# Patient Record
Sex: Male | Born: 1955
Health system: Southern US, Community
[De-identification: ages and names within clinical notes are randomized; demographics above are authoritative.]

## PROBLEM LIST (undated history)

## (undated) DIAGNOSIS — G709 Myoneural disorder, unspecified: Secondary | ICD-10-CM

## (undated) DIAGNOSIS — G20A1 Parkinson's disease without dyskinesia, without mention of fluctuations: Secondary | ICD-10-CM

## (undated) DIAGNOSIS — K4090 Unilateral inguinal hernia, without obstruction or gangrene, not specified as recurrent: Secondary | ICD-10-CM

## (undated) DIAGNOSIS — J61 Pneumoconiosis due to asbestos and other mineral fibers: Secondary | ICD-10-CM

## (undated) DIAGNOSIS — G20 Parkinson's disease: Secondary | ICD-10-CM

## (undated) HISTORY — DX: Pneumoconiosis due to asbestos and other mineral fibers: J61

## (undated) HISTORY — PX: COLONOSCOPY: SHX5424

## (undated) HISTORY — DX: Parkinson's disease without dyskinesia, without mention of fluctuations: G20.A1

## (undated) HISTORY — PX: ELBOW SURGERY: SHX618

## (undated) HISTORY — DX: Parkinson's disease: G20

## (undated) HISTORY — DX: Myoneural disorder, unspecified: G70.9

---

## 1999-01-11 ENCOUNTER — Ambulatory Visit (HOSPITAL_COMMUNITY): Admission: RE | Admit: 1999-01-11 | Discharge: 1999-01-11 | Payer: Self-pay

## 2005-02-26 ENCOUNTER — Encounter: Admission: RE | Admit: 2005-02-26 | Discharge: 2005-02-26 | Payer: Self-pay | Admitting: Family Medicine

## 2005-08-29 ENCOUNTER — Encounter: Admission: RE | Admit: 2005-08-29 | Discharge: 2005-08-29 | Payer: Self-pay | Admitting: Family Medicine

## 2006-09-24 ENCOUNTER — Encounter: Admission: RE | Admit: 2006-09-24 | Discharge: 2006-09-24 | Payer: Self-pay | Admitting: Family Medicine

## 2014-08-19 ENCOUNTER — Telehealth: Payer: Self-pay | Admitting: Family Medicine

## 2014-08-19 NOTE — Telephone Encounter (Signed)
Pt came by and appt was made

## 2014-08-19 NOTE — Telephone Encounter (Signed)
Pt states he is your neighbor and you know him by Aaron Robertson, he needs to be seen as a new pt to re-establish care since it has been greater than 3 years since we have seen him. He is concerned about Parkinsons Disease and thinks he is showing symptoms. I explained we don't have new pt appts at this time but he insists he is your neighbor and you would make an exception. Please advise

## 2014-08-22 ENCOUNTER — Ambulatory Visit (INDEPENDENT_AMBULATORY_CARE_PROVIDER_SITE_OTHER): Payer: 59 | Admitting: Family Medicine

## 2014-08-22 ENCOUNTER — Encounter: Payer: Self-pay | Admitting: Family Medicine

## 2014-08-22 ENCOUNTER — Encounter (INDEPENDENT_AMBULATORY_CARE_PROVIDER_SITE_OTHER): Payer: Self-pay

## 2014-08-22 ENCOUNTER — Ambulatory Visit (INDEPENDENT_AMBULATORY_CARE_PROVIDER_SITE_OTHER): Payer: 59

## 2014-08-22 VITALS — BP 122/74 | HR 65 | Temp 96.8°F | Ht 68.0 in | Wt 159.0 lb

## 2014-08-22 DIAGNOSIS — R5383 Other fatigue: Secondary | ICD-10-CM

## 2014-08-22 DIAGNOSIS — N400 Enlarged prostate without lower urinary tract symptoms: Secondary | ICD-10-CM | POA: Insufficient documentation

## 2014-08-22 DIAGNOSIS — R251 Tremor, unspecified: Secondary | ICD-10-CM

## 2014-08-22 DIAGNOSIS — G259 Extrapyramidal and movement disorder, unspecified: Secondary | ICD-10-CM

## 2014-08-22 DIAGNOSIS — Z0000 Encounter for general adult medical examination without abnormal findings: Secondary | ICD-10-CM

## 2014-08-22 DIAGNOSIS — R269 Unspecified abnormalities of gait and mobility: Secondary | ICD-10-CM | POA: Insufficient documentation

## 2014-08-22 LAB — POCT UA - MICROSCOPIC ONLY
Bacteria, U Microscopic: NEGATIVE
CASTS, UR, LPF, POC: NEGATIVE
CRYSTALS, UR, HPF, POC: NEGATIVE
MUCUS UA: NEGATIVE
WBC, Ur, HPF, POC: NEGATIVE
YEAST UA: NEGATIVE

## 2014-08-22 LAB — POCT URINALYSIS DIPSTICK
Bilirubin, UA: NEGATIVE
Glucose, UA: NEGATIVE
Ketones, UA: NEGATIVE
Leukocytes, UA: NEGATIVE
Nitrite, UA: NEGATIVE
Protein, UA: NEGATIVE
Spec Grav, UA: 1.015
Urobilinogen, UA: NEGATIVE
pH, UA: 6.5

## 2014-08-22 NOTE — Addendum Note (Signed)
Addended by: Selmer Dominion on: 08/22/2014 04:03 PM   Modules accepted: Orders

## 2014-08-22 NOTE — Progress Notes (Signed)
Subjective:    Patient ID: Aaron Robertson, male    DOB: 05-Dec-1955, 58 y.o.   MRN: [id number]  HPI Pt here for complete physical exam.the patient does have concerns about gait issues and dragging his feet and a weaker voice.he has also had some bilateral hand tremors.the patient denies chest pain and shortness of breath and trouble passing his water. He describes the tremors that he has worse on the right that had been going on for almost a year. The issues with talking and reduce strength seemed to be more recent as in the past 3 or 4 months. He also noticed that the tremor is worse in the right hand. He has had weight loss that was not from diet. It is significant that his father had Alzheimer's disease and his paternal grandmother had ALS. He has 2 younger siblings that are both male and they are in good health. He has 2 children and they are alive and well.      Review of Systems  Constitutional: Negative.   HENT: Negative.   Eyes: Negative.   Respiratory: Negative.   Cardiovascular: Negative.   Gastrointestinal: Negative.   Endocrine: Negative.   Genitourinary: Negative.   Musculoskeletal: Positive for gait problem.       Toes on the right foot had some burning when walking and now he notices he starts dragging his feet.  Skin: Negative.   Allergic/Immunologic: Negative.   Neurological: Positive for tremors.       Bilateral hand tremors, more in the right hand.  Has noticed that recently "his voice doesn't carry".  Hematological: Negative.   Psychiatric/Behavioral: Negative.        Objective:   Physical Exam  Constitutional: He is oriented to person, place, and time. He appears well-developed and well-nourished. No distress.  HENT:  Head: Normocephalic and atraumatic.  Nose: Nose normal.  Mouth/Throat: Oropharynx is clear and moist. No oropharyngeal exudate.  Bilateral ears cerumen  Eyes: Conjunctivae and EOM are normal. Pupils are equal, round, and reactive to  light. Right eye exhibits no discharge. Left eye exhibits no discharge. No scleral icterus.  Neck: Normal range of motion. Neck supple. No thyromegaly present.  No carotid bruits or anterior cervical adenopathy  Cardiovascular: Normal rate, regular rhythm, normal heart sounds and intact distal pulses.  Exam reveals no gallop and no friction rub.   No murmur heard. At 72/m  Pulmonary/Chest: Effort normal and breath sounds normal. No respiratory distress. He has no wheezes. He has no rales. He exhibits no tenderness.  No axillary adenopathy  Abdominal: Soft. Bowel sounds are normal. He exhibits no mass. There is no tenderness. There is no rebound and no guarding.  Genitourinary: Rectum normal and penis normal.  The prostate was enlarged but smooth. There were no rectal masses. There was no nodularity in the prostate. There were no inguinal nodes palpable. The external genitalia were normal and there were no hernias palpated.  Musculoskeletal: Normal range of motion. He exhibits no edema or tenderness.  Lymphadenopathy:    He has no cervical adenopathy.  Neurological: He is alert and oriented to person, place, and time. He has normal reflexes. No cranial nerve deficit.  Skin: Skin is warm and dry. No rash noted. No erythema. No pallor.  Psychiatric: He has a normal mood and affect. His behavior is normal. Judgment and thought content normal.  Nursing note and vitals reviewed.   BP 122/74 mmHg  Pulse 65  Temp(Src) 96.8 F (36 C) (Oral)  Ht 5' 8"  (1.727 m)  Wt 159 lb (72.122 kg)  BMI 24.18 kg/m2  JOI:NOMVEH sinus rhythm, normal EKG  [facility] reading (PRIMARY) by  Dr. Brunilda Payor x-ray--no active disease   MMSE 29 out of 30                                       Assessment & Plan:  1. Annual physical exam - POCT CBC; Future - BMP8+EGFR; Future - Hepatic function panel; Future - POCT urinalysis dipstick - POCT UA - Microscopic Only - Urine culture - Thyroid Panel With TSH;  Future - Vit D  25 hydroxy (rtn osteoporosis monitoring); Future - NMR, lipoprofile; Future - PSA, total and free; Future - EKG 12-Lead - DG Chest 2 View; Future  2. Other fatigue - POCT CBC; Future - BMP8+EGFR; Future - Hepatic function panel; Future - POCT urinalysis dipstick - POCT UA - Microscopic Only - Urine culture - Thyroid Panel With TSH; Future - Vit D  25 hydroxy (rtn osteoporosis monitoring); Future - NMR, lipoprofile; Future - PSA, total and free; Future - Ambulatory referral to Neurology; Future - EKG 12-Lead - DG Chest 2 View; Future  3. Tremors of nervous system - POCT CBC; Future - BMP8+EGFR; Future - Hepatic function panel; Future - Thyroid Panel With TSH; Future - Ambulatory referral to Neurology; Future  4. Abnormality of gait - POCT CBC; Future - Ambulatory referral to Neurology; Future  5. BPH (benign prostatic hyperplasia)  6. Movement disorder -referral to neurologist  Patient Instructions  Please return and get lab work as directed Return the FOBT We will call you with the results of the chest x-ray once those results are available Try to exercise frequently with rest periods during the day Eat as healthy as possible drink plenty of fluids We will arrange a visit with the neurologist hopefully in a month's time. Continue to be careful and do not put herself at risk for falling i.e. Avoid climbing as much as possible Check with your insurance regarding the Prevnar vaccine   Arrie Senate MD

## 2014-08-22 NOTE — Patient Instructions (Signed)
Please return and get lab work as directed Return the FOBT We will call you with the results of the chest x-ray once those results are available Try to exercise frequently with rest periods during the day Eat as healthy as possible drink plenty of fluids We will arrange a visit with the neurologist hopefully in a month's time. Continue to be careful and do not put herself at risk for falling i.e. Avoid climbing as much as possible Check with your insurance regarding the Prevnar vaccine

## 2014-08-23 LAB — URINE CULTURE: Organism ID, Bacteria: NO GROWTH

## 2014-08-24 ENCOUNTER — Other Ambulatory Visit (INDEPENDENT_AMBULATORY_CARE_PROVIDER_SITE_OTHER): Payer: 59

## 2014-08-24 DIAGNOSIS — G252 Other specified forms of tremor: Secondary | ICD-10-CM

## 2014-08-24 DIAGNOSIS — R5383 Other fatigue: Secondary | ICD-10-CM

## 2014-08-24 DIAGNOSIS — Z0000 Encounter for general adult medical examination without abnormal findings: Secondary | ICD-10-CM

## 2014-08-24 DIAGNOSIS — G250 Essential tremor: Secondary | ICD-10-CM

## 2014-08-24 DIAGNOSIS — R251 Tremor, unspecified: Secondary | ICD-10-CM

## 2014-08-24 LAB — POCT CBC
GRANULOCYTE PERCENT: 53.1 % (ref 37–80)
HEMATOCRIT: 45.7 % (ref 43.5–53.7)
Hemoglobin: 14.9 g/dL (ref 14.1–18.1)
Lymph, poc: 1.7 (ref 0.6–3.4)
MCH, POC: 29.7 pg (ref 27–31.2)
MCHC: 32.6 g/dL (ref 31.8–35.4)
MCV: 91.3 fL (ref 80–97)
MPV: 8.4 fL (ref 0–99.8)
POC Granulocyte: 2.2 (ref 2–6.9)
POC LYMPH %: 41.6 % (ref 10–50)
Platelet Count, POC: 163 10*3/uL (ref 142–424)
RBC: 5 M/uL (ref 4.69–6.13)
RDW, POC: 13.3 %
WBC: 4.2 10*3/uL — AB (ref 4.6–10.2)

## 2014-08-24 NOTE — Addendum Note (Signed)
Addended by: Wyline Mood on: 08/24/2014 08:13 AM   Modules accepted: Orders

## 2014-08-25 ENCOUNTER — Telehealth: Payer: Self-pay | Admitting: *Deleted

## 2014-08-25 LAB — BMP8+EGFR
BUN/Creatinine Ratio: 18 (ref 9–20)
BUN: 17 mg/dL (ref 6–24)
CALCIUM: 9.1 mg/dL (ref 8.7–10.2)
CO2: 27 mmol/L (ref 18–29)
CREATININE: 0.97 mg/dL (ref 0.76–1.27)
Chloride: 98 mmol/L (ref 97–108)
GFR calc Af Amer: 98 mL/min/{1.73_m2} (ref >59)
GFR, EST NON AFRICAN AMERICAN: 85 mL/min/{1.73_m2} (ref >59)
Glucose: 92 mg/dL (ref 65–99)
Potassium: 4.4 mmol/L (ref 3.5–5.2)
SODIUM: 140 mmol/L (ref 134–144)

## 2014-08-25 LAB — THYROID PANEL WITH TSH
FREE THYROXINE INDEX: 2 (ref 1.2–4.9)
T3 Uptake Ratio: 30 % (ref 24–39)
T4 TOTAL: 6.8 ug/dL (ref 4.5–12.0)
TSH: 2.63 u[IU]/mL (ref 0.450–4.500)

## 2014-08-25 LAB — PSA, TOTAL AND FREE
PSA FREE PCT: 62.5 %
PSA, Free: 0.25 ng/mL
PSA: 0.4 ng/mL (ref 0.0–4.0)

## 2014-08-25 LAB — HEPATIC FUNCTION PANEL
ALBUMIN: 4.1 g/dL (ref 3.5–5.5)
ALT: 12 IU/L (ref 0–44)
AST: 22 IU/L (ref 0–40)
Alkaline Phosphatase: 72 IU/L (ref 39–117)
BILIRUBIN DIRECT: 0.17 mg/dL (ref 0.00–0.40)
TOTAL PROTEIN: 6.6 g/dL (ref 6.0–8.5)
Total Bilirubin: 0.8 mg/dL (ref 0.0–1.2)

## 2014-08-25 LAB — NMR, LIPOPROFILE
Cholesterol: 210 mg/dL — ABNORMAL HIGH (ref 100–199)
HDL Cholesterol by NMR: 66 mg/dL (ref >39)
HDL Particle Number: 36.1 umol/L (ref >=30.5)
LDL PARTICLE NUMBER: 1610 nmol/L — AB (ref <1000)
LDL SIZE: 21 nm (ref >20.5)
LDL-C: 130 mg/dL — ABNORMAL HIGH (ref 0–99)
LP-IR Score: <25 (ref <=45)
Small LDL Particle Number: 704 nmol/L — ABNORMAL HIGH (ref <=527)
TRIGLYCERIDES BY NMR: 71 mg/dL (ref 0–149)

## 2014-08-25 LAB — VITAMIN D 25 HYDROXY (VIT D DEFICIENCY, FRACTURES): VIT D 25 HYDROXY: 32.7 ng/mL (ref 30.0–100.0)

## 2014-08-25 NOTE — Telephone Encounter (Signed)
Aware of results and was scheduled with clinical pharmacist.

## 2014-08-25 NOTE — Telephone Encounter (Signed)
-----   Message from Chipper Herb, MD sent at 08/25/2014  7:28 AM EST ----- All thyroid function tests are within normal limits The vitamin D level is at the low end of the normal range, please have the patient start vitamin D3 1000, one daily. The Now Brand is preferred---this is at [facility] or the drugstore in [place] Cholesterol numbers with advanced lipid testing have an LDL particle number that is elevated at 1610. The LDL C is elevated at 130.  The triglycerides are good at 71. The good cholesterol the HDL particle number is good.----Please schedule this patient for an appointment with the clinical pharmacist to further discuss cholesterol management and treatment+++++++ The PSA remains low and within normal limits The blood sugar is good at 92. The creatinine, the most important kidney function test is within normal limits. The electrolytes including potassium are within normal limits All liver function tests are within normal limits

## 2014-09-27 ENCOUNTER — Ambulatory Visit (INDEPENDENT_AMBULATORY_CARE_PROVIDER_SITE_OTHER): Payer: 59 | Admitting: Neurology

## 2014-09-27 ENCOUNTER — Encounter: Payer: Self-pay | Admitting: Neurology

## 2014-09-27 VITALS — BP 128/72 | HR 80 | Ht 68.0 in | Wt 159.0 lb

## 2014-09-27 DIAGNOSIS — G20A1 Parkinson's disease without dyskinesia, without mention of fluctuations: Secondary | ICD-10-CM | POA: Insufficient documentation

## 2014-09-27 DIAGNOSIS — G20 Parkinson's disease: Secondary | ICD-10-CM | POA: Insufficient documentation

## 2014-09-27 NOTE — Patient Instructions (Signed)
1.  I will see you back in 6 months.  Call me if you need me before tht time 2.  You can look up on the [place] website our event on 12/23/14 and will be able to register there soon.  The event will be at the [facility].

## 2014-09-27 NOTE — Progress Notes (Signed)
Aaron Robertson was seen today in the movement disorders clinic for neurologic consultation at the request of [name], MD.  The consultation is for the evaluation of tremor, voice changes and gait change.   Sx's have been going on for 6 months.  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  He states that he would concentrate on it and it would go away.  It actually has been better over the last few weeks/months.  In fact, he states that most of the above sx's are better than they were a few months ago.  Specific Symptoms:  Tremor: Yes.   (better than it was, as above) Voice: yes, but always been soft spoken Sleep: sleeps well  Vivid Dreams:  No.  Acting out dreams:  No. Wet Pillows: No. Postural symptoms:  No.  Falls?  No. Bradykinesia symptoms: drooling while awake (very minimal when riding horses may notice this) Loss of smell:  Yes.   Loss of taste:  No. Urinary Incontinence:  No. Difficulty Swallowing:  Yes.   (minimal, if drinks liquid too quick) Handwriting, micrographia: Yes.   (seems better lately; is R hand dominant) Trouble with ADL's:  No.  Trouble buttoning clothing: Yes.   Depression:  No. Memory changes:  No. Hallucinations:  No.  visual distortions: No. N/V:  No. Lightheaded:  No.  Syncope: No. Diplopia:  No. Dyskinesia:  No.  Neuroimaging has not previously been performed.   PREVIOUS MEDICATIONS: none to date  ALLERGIES:  No Known Allergies  CURRENT MEDICATIONS:  Outpatient Encounter Prescriptions as of 09/27/2014  Medication Sig  . b complex vitamins tablet Take 1 tablet by mouth daily.  . Cholecalciferol (VITAMIN D PO) Take 1,000 mg by mouth daily.  Marland Kitchen glucosamine-chondroitin 500-400 MG tablet Take 1 tablet by mouth daily.  . Multiple Vitamin (MULTIVITAMIN) tablet Take 1 tablet by mouth daily.  Marland Kitchen OVER THE COUNTER MEDICATION Protandum - 1 daily    PAST MEDICAL HISTORY:  No past  medical history on file.  PAST SURGICAL HISTORY:   Past Surgical History  Procedure Laterality Date  . Arm wound repair / closure      SOCIAL HISTORY:   History   Social History  . Marital Status: Married    Spouse Name: N/A    Number of Children: N/A  . Years of Education: N/A   Occupational History  . Not on file.   Social History Main Topics  . Smoking status: Never Smoker   . Smokeless tobacco: Not on file  . Alcohol Use: 0.0 oz/week    0 Not specified per week     Comment: rare - maybe 4 oz in a year  . Drug Use: No  . Sexual Activity: Not on file   Other Topics Concern  . Not on file   Social History Narrative    FAMILY HISTORY:   Family Status  Relation Status Death Age  . Mother Alive     heart disease  . Maternal Uncle Deceased   . Father Deceased     alzheimer's   . Paternal Grandmother Deceased     ALS  . Sister Alive     hyperlipidemia  . Sister Alive     hyperlipidemia  . Son Alive     healthy  . Daughter Alive     healthy    ROS:  A complete 10 system review of systems was obtained  and was unremarkable apart from what is mentioned above.  PHYSICAL EXAMINATION:    VITALS:   Filed Vitals:   09/27/14 1234  BP: 128/72  Pulse: 80  Height: 5\' 8"  (1.727 m)  Weight: 159 lb (72.122 kg)    GEN:  The patient appears stated age and is in NAD. HEENT:  Normocephalic, atraumatic.  The mucous membranes are moist. The superficial temporal arteries are without ropiness or tenderness. CV:  RRR Lungs:  CTAB Neck/HEME:  There are no carotid bruits bilaterally.  Neurological examination:  Orientation: The patient is alert and oriented x3. Fund of knowledge is appropriate.  Recent and remote memory are intact.  Attention and concentration are normal.    Able to name objects and repeat phrases. Cranial nerves: There is good facial symmetry. There is significant facial hypomimia.  Pupils are equal round and reactive to light bilaterally. Fundoscopic  exam reveals clear margins bilaterally. Extraocular muscles are intact. The visual fields are full to confrontational testing. The speech is fluent and clear. There is hypophonic speech.  Soft palate rises symmetrically and there is no tongue deviation. Hearing is intact to conversational tone. Sensation: Sensation is intact to light and pinprick throughout (facial, trunk, extremities). Vibration is intact at the bilateral big toe. There is no extinction with double simultaneous stimulation. There is no sensory dermatomal level identified. Motor: Strength is 5/5 in the bilateral upper and lower extremities.   Shoulder shrug is equal and symmetric.  There is no pronator drift. Deep tendon reflexes: Deep tendon reflexes are 2+-3-/4 at the bilateral biceps, triceps, brachioradialis, patella and achilles. Plantar responses are downgoing bilaterally.  Movement examination: Tone: There is increased tone in the RUE, overall moderate.  Tone elsewhere is normal.   Abnormal movements: no significant tremor, even with distraction.   Coordination:  There is minor decremation with RAM's, seen mostly with finger taps on the right Gait and Station: The patient has no difficulty arising out of a deep-seated chair without the use of the hands. The patient's stride length is normal but there is decreased arm swing on bilaterally.  The patient has a negative pull test.      ASSESSMENT/PLAN:  1.  Parkinsonism.  I suspect that this does represent idiopathic Parkinson's disease.  The patient has tremor, bradykinesia, rigidity and mild postural instability.  -We discussed the diagnosis as well as pathophysiology of the disease.  We discussed treatment options as well as prognostic indicators.  Patient education was provided.  Invited to PD support group and to pt event in March in [place].  -Greater than 50% of the 80 minute visit was spent in counseling answering questions and talking about what to expect now as well as  in the future.  We talked about medication options as well as potential future surgical options.  We talked about safety in the home.  Pt had researched some unconventional ideas regarding Parkinson's.  Thought that any treatment that was given and would speed up the loss of any intrinsic dopamine.  Explained to the patient that by the time the patient is diagnosed with Parkinson's disease about 85% of intrinsic dopamine is statistically lost already.  Explained to him that medication is symptomatic, but does not speed up that loss.  He also felt that taking medication just makes drs and pharmaceutical companies more wealthy and explained to the patient that whether or not he takes medications makes no difference in terms of our reimbursement and explained to him that most PD meds are  generic.  Talked to him in detail about various meds, although I would recommend the a DA agonist for him and he is not interested in medication.  He did say that he contacted a company already (in [place] I believe) regarding stem cell transplant and I discussed with him that we don't do human stem cell transplant for PD right now in the clinical setting.    -Talked to him about studies on safe CV exercise and encouraged this highly  -He is taking a supplement called protandim.  I did research it and as far as I know the ingredients don't contribute to PD but I also don't believe in its anti-aging claims as the patient firmly believes.  We did discuss this some today, but focused more on PD, as above. 2.  Pt wanted to f/u prn but told pt that I would like to at least see him q 6 months, sooner if he should have any issues, just so that he doesn't get lost to f/u.

## 2014-09-29 ENCOUNTER — Ambulatory Visit (INDEPENDENT_AMBULATORY_CARE_PROVIDER_SITE_OTHER): Payer: 59 | Admitting: Pharmacist

## 2014-09-29 ENCOUNTER — Encounter: Payer: Self-pay | Admitting: Pharmacist

## 2014-09-29 ENCOUNTER — Telehealth: Payer: Self-pay | Admitting: Neurology

## 2014-09-29 VITALS — BP 101/62 | HR 70 | Ht 68.0 in | Wt 158.0 lb

## 2014-09-29 DIAGNOSIS — E785 Hyperlipidemia, unspecified: Secondary | ICD-10-CM | POA: Insufficient documentation

## 2014-09-29 NOTE — Telephone Encounter (Signed)
Pt called

## 2014-09-29 NOTE — Progress Notes (Signed)
Lipid Clinic Consultation  Chief Complaint:   Chief Complaint  Patient presents with  . Hyperlipidemia     HPI:  First visit with clinical pharmacist to discuss elevated lipids.  No family of early CV disease but patient's paternal grandfather has a stoke at 59yo and patient's father had stroke at age 3yo. No history of HTN - actually patient BP is very good. No diabetes.  No history of smoking or other tobacco use.      Component Value Date/Time   CHOL 210 08/24/2014   LDL 130 08/24/2014   LDL-P 1610 08/24/2014   HDL 66 08/24/2014   TRIG 71 08/24/2014    CHD/CHF Risk Equivalents:  none AHA ASCVD 10 year risk = 6.2% NCEP Risk Factors Present:  age Primary Problem(s):  LDL or LDL-P elevated  Current NCEP Goals: LDL Goal < 100 HDL Goal >/= 45 Tg Goal < 150 Non-HDL Goal < 130  Secondary cause of hyperlipidemia present:  none Low fat diet followed?  No -   Low carb diet followed?  No -   Exercise?  No - but at the request of Dr [name] he is starting exercise 40 minutes 5 days per week due to Parkinson's disease   Assessment:  Hyperlipidemia   Recommendations: 1.  A moderate intensity statin is recommended but patient has refused statin therapy until he tries diet for 90 days first. Patient agrees to statin if LDL lower in 90 days. 2.  Recommended and Discussed Mediterranean Diet - increase whole grains, antioxidant risk fruits and vegetables Limit red meat and animal fat sources.  Lean proteins and eggs ok.  Increase nuts and monounsaturated fats. 3.  Continue as planned to start aerobic exercise 40 minutes 5 days per week.  Recheck Lipid Panel:  Feb 2016   Time spent counseling patient:  30 minutes   Referring Provider:  [name] MD    PharmD:  Cherre Robins, [facility]

## 2014-09-29 NOTE — Telephone Encounter (Signed)
Pt called wanting to speak to a nurse regarding his work note. Please call pt # [phone]

## 2014-09-29 NOTE — Telephone Encounter (Signed)
Work note completed and faxed to patient at [phone] with confirmation received per patient request.

## 2014-11-23 ENCOUNTER — Encounter: Payer: Self-pay | Admitting: Family Medicine

## 2014-11-23 ENCOUNTER — Ambulatory Visit (INDEPENDENT_AMBULATORY_CARE_PROVIDER_SITE_OTHER): Payer: 59 | Admitting: Family Medicine

## 2014-11-23 VITALS — BP 128/79 | HR 59 | Temp 97.1°F | Ht 68.0 in | Wt 160.0 lb

## 2014-11-23 DIAGNOSIS — N400 Enlarged prostate without lower urinary tract symptoms: Secondary | ICD-10-CM

## 2014-11-23 DIAGNOSIS — E785 Hyperlipidemia, unspecified: Secondary | ICD-10-CM

## 2014-11-23 DIAGNOSIS — R251 Tremor, unspecified: Secondary | ICD-10-CM

## 2014-11-23 DIAGNOSIS — R269 Unspecified abnormalities of gait and mobility: Secondary | ICD-10-CM

## 2014-11-23 NOTE — Patient Instructions (Addendum)
Continue current medications. Continue good therapeutic lifestyle changes which include good diet and exercise. Fall precautions discussed with patient. If an FOBT was given today- please return it to our front desk. If you are over 60 years old - you may need Prevnar 52 or the adult Pneumonia vaccine.  Flu Shots are still available at our office. If you still haven't had one please call to set up a nurse visit to get one.   After your visit with [place] today you will receive a survey in the mail or online from [employer] regarding your care with [place]. Please take a moment to fill this out. Your feedback is very important to [place] as you can help [place] better understand your patient needs as well as improve your experience and satisfaction. WE CARE ABOUT YOU!!!   Return to clinic in a 2-3 months and get lab work to follow-up on cholesterol Keep follow-up appointment with neurologist in June and a whenever that is scheduled continue to exercise regularly and eat healthily

## 2014-11-23 NOTE — Progress Notes (Signed)
Subjective:    Patient ID: Aaron Robertson, male    DOB: 22-Sep-1956, 59 y.o.   MRN: [id number]  HPI Pt here for follow up and management of chronic medical problems which includes hyperlipidemia and Parkinson's disease. The patient saw Dr. And feels comfortable with her and we'll see her again in 6 months after the December visit which will be sometime in June. He is taking over the counter medications regularly. He has been exercising regularly and he has been feeling well as far as the Parkinson's is concerned he is been trying to watch his diet more closely. He has not had any other complaints or symptoms. His had no chest pain shortness of breath or GI symptoms. He does have a slight tremor that he noticed in his left leg at nighttime but said that he rolled over and this got better and did not keep him awake.        Patient Active Problem List   Diagnosis Date Noted  . Hyperlipidemia 09/29/2014  . Parkinson's disease 09/27/2014  . Other fatigue 08/22/2014  . Tremors of nervous system 08/22/2014  . Abnormality of gait 08/22/2014  . BPH (benign prostatic hyperplasia) 08/22/2014  . Movement disorder 08/22/2014   Outpatient Encounter Prescriptions as of 11/23/2014  Medication Sig  . b complex vitamins tablet Take 1 tablet by mouth daily.  . Cholecalciferol (VITAMIN D PO) Take 1,000 mg by mouth daily.  Marland Kitchen glucosamine-chondroitin 500-400 MG tablet Take 1 tablet by mouth daily.  . Multiple Vitamin (MULTIVITAMIN) tablet Take 1 tablet by mouth daily.  Marland Kitchen OVER THE COUNTER MEDICATION Protandim - 1 daily    Review of Systems  Constitutional: Negative.   HENT: Negative.   Eyes: Negative.   Respiratory: Negative.   Cardiovascular: Negative.   Gastrointestinal: Negative.   Endocrine: Negative.   Genitourinary: Negative.   Musculoskeletal: Negative.   Skin: Negative.   Allergic/Immunologic: Negative.   Neurological: Positive for tremors (right lower - mostly at night).    Hematological: Negative.   Psychiatric/Behavioral: Negative.        Objective:   Physical Exam  Constitutional: He is oriented to person, place, and time. He appears well-developed and well-nourished. No distress.  HENT:  Head: Normocephalic and atraumatic.  Right Ear: External ear normal.  Left Ear: External ear normal.  Mouth/Throat: Oropharynx is clear and moist. No oropharyngeal exudate.  Nasal congestion  Eyes: Conjunctivae and EOM are normal. Pupils are equal, round, and reactive to light. Right eye exhibits no discharge. Left eye exhibits no discharge. No scleral icterus.  Neck: Normal range of motion. Neck supple. No thyromegaly present.  Cardiovascular: Normal rate, regular rhythm, normal heart sounds and intact distal pulses.   No murmur heard. Pulmonary/Chest: Effort normal and breath sounds normal. No respiratory distress. He has no wheezes. He has no rales. He exhibits no tenderness.  Abdominal: Soft. Bowel sounds are normal. He exhibits no mass. There is no tenderness. There is no rebound and no guarding.  Musculoskeletal: Normal range of motion. He exhibits no edema or tenderness.  Lymphadenopathy:    He has no cervical adenopathy.  Neurological: He is alert and oriented to person, place, and time. He has normal reflexes. No cranial nerve deficit.  Minimal if any tremor noted in hands and no tremor noted in lower extremities.  Skin: Skin is warm and dry. No rash noted. No erythema. No pallor.  Psychiatric: He has a normal mood and affect. His behavior is normal. Judgment and thought content normal.  Nursing note and vitals reviewed.  BP 128/79 mmHg  Pulse 59  Temp(Src) 97.1 F (36.2 C) (Oral)  Ht _0  (1.727 m)  Wt 160 lb (72.576 kg)  BMI 24.33 kg/m2        Assessment & Plan:  1. Hyperlipidemia -Continue diet and regular exercise regimen - BMP8+EGFR; Future - NMR, lipoprofile; Future - Hepatic function panel; Future  2. BPH (benign prostatic  hyperplasia) -The patient is having no symptoms with this and maybe it's up at most one time at nighttime  3. Abnormality of gait -The patient is doing better with his gait  4. Tremors of nervous system -Patient will continue to exercise regularly and follow-up with the neurologist in May  Patient Instructions  Continue current medications. Continue good therapeutic lifestyle changes which include good diet and exercise. Fall precautions discussed with patient. If an FOBT was given today- please return it to our front desk. If you are over 18 years old - you may need Prevnar 42 or the adult Pneumonia vaccine.  Flu Shots are still available at our office. If you still haven't had one please call to set up a nurse visit to get one.   After your visit with [place] today you will receive a survey in the mail or online from [employer] regarding your care with [place]. Please take a moment to fill this out. Your feedback is very important to [place] as you can help [place] better understand your patient needs as well as improve your experience and satisfaction. WE CARE ABOUT YOU!!!   Return to clinic in a 2-3 months and get lab work to follow-up on cholesterol Keep follow-up appointment with neurologist in June and a whenever that is scheduled continue to exercise regularly and eat healthily    Arrie Senate MD

## 2014-12-02 ENCOUNTER — Other Ambulatory Visit (INDEPENDENT_AMBULATORY_CARE_PROVIDER_SITE_OTHER): Payer: 59

## 2014-12-02 DIAGNOSIS — Z1212 Encounter for screening for malignant neoplasm of rectum: Secondary | ICD-10-CM

## 2014-12-02 NOTE — Progress Notes (Signed)
Lab only 

## 2014-12-04 LAB — FECAL OCCULT BLOOD, IMMUNOCHEMICAL: FECAL OCCULT BLD: NEGATIVE

## 2015-02-16 ENCOUNTER — Ambulatory Visit (INDEPENDENT_AMBULATORY_CARE_PROVIDER_SITE_OTHER): Payer: 59 | Admitting: Neurology

## 2015-02-16 ENCOUNTER — Encounter: Payer: Self-pay | Admitting: Neurology

## 2015-02-16 VITALS — BP 130/78 | HR 68 | Ht 68.0 in | Wt 156.0 lb

## 2015-02-16 DIAGNOSIS — G20 Parkinson's disease: Secondary | ICD-10-CM | POA: Diagnosis not present

## 2015-02-16 NOTE — Progress Notes (Signed)
Aaron Robertson was seen today in the movement disorders clinic for neurologic consultation at the request of Aaron Gainer, MD.  The consultation is for the evaluation of tremor, voice changes and gait change.   Sx's have been going on for 6 months.  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  He states that he would concentrate on it and it would go away.  It actually has been better over the last few weeks/months.  In fact, he states that most of the above sx's are better than they were a few months ago.  02/16/15 update:  The patient presents today for follow-up.  He was diagnosed with Parkinson's disease last visit, but refuses all medication for it, as he unfortunately believes that starting medication would speed up the degenerative process, even though no literature supports this.  He also states today that he just really doesn't feel he needs it.  He cannot tell that he is rigid and generally feels well most days.  Pt brings in an "extra" video clip that lasted a full 6 minutes that he asked me to look at with him regarding the "protandim" that he is now taking.  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  Occasionally rides on the bike.  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  Thinks that there may be a buyout this summer at work and thinks that he may take that if it is an option.  States that tremor is better.    Neuroimaging has not previously been performed.   PREVIOUS MEDICATIONS: none to date  ALLERGIES:  No Known Allergies  CURRENT MEDICATIONS:  Outpatient Encounter Prescriptions as of 02/16/2015  Medication Sig  . OVER THE COUNTER MEDICATION Protandim - 1 daily  . [DISCONTINUED] b complex vitamins tablet Take 1 tablet by mouth daily.  . [DISCONTINUED] Cholecalciferol (VITAMIN D PO) Take  1,000 mg by mouth daily.  . [DISCONTINUED] glucosamine-chondroitin 500-400 MG tablet Take 1 tablet by mouth daily.  . [DISCONTINUED] Multiple Vitamin (MULTIVITAMIN) tablet Take 1 tablet by mouth daily.   No facility-administered encounter medications on file as of 02/16/2015.    PAST MEDICAL HISTORY:   Past Medical History  Diagnosis Date  . Parkinson's disease     PAST SURGICAL HISTORY:   Past Surgical History  Procedure Laterality Date  . Arm wound repair / closure      SOCIAL HISTORY:   History   Social History  . Marital Status: Married    Spouse Name: N/A  . Number of Children: N/A  . Years of Education: N/A   Occupational History  . Not on file.   Social History Main Topics  . Smoking status: Never Smoker   . Smokeless tobacco: Not on file  . Alcohol Use: 0.0 oz/week    0 Standard drinks or equivalent per week     Comment: rare - maybe 4 oz in a year  . Drug Use: No  . Sexual Activity: Not on file   Other Topics Concern  . Not on file   Social History Narrative    FAMILY HISTORY:   Family Status  Relation Status Death Age  . Mother Alive     heart disease  . Maternal Uncle Deceased   . Father  Deceased     alzheimer's   . Paternal Grandmother Deceased     ALS  . Sister Alive     hyperlipidemia  . Sister Alive     hyperlipidemia  . Son Alive     healthy  . Daughter Alive     healthy    ROS:  A complete 10 system review of systems was obtained and was unremarkable apart from what is mentioned above.  PHYSICAL EXAMINATION:    VITALS:   Filed Vitals:   02/16/15 1316  BP: 130/78  Pulse: 68  Height: 5\' 8"  (1.727 m)  Weight: 156 lb (70.761 kg)    GEN:  The patient appears stated age and is in NAD. HEENT:  Normocephalic, atraumatic.  The mucous membranes are moist. The superficial temporal arteries are without ropiness or tenderness. CV:  RRR Lungs:  CTAB Neck/HEME:  There are no carotid bruits bilaterally.  Neurological  examination:  Orientation: The patient is alert and oriented x3.  Cranial nerves: There is good facial symmetry. There is significant facial hypomimia.   The visual fields are full to confrontational testing. The speech is fluent and clear. There is hypophonic speech.  Soft palate rises symmetrically and there is no tongue deviation. Hearing is intact to conversational tone. Sensation: Sensation is intact to light touch throughout. Motor: Strength is 5/5 in the bilateral upper and lower extremities.   Shoulder shrug is equal and symmetric.  There is no pronator drift.   Movement examination: Tone: There is increased tone in the RUE, overall moderate but mod-severe with distraction procedures.  Tone in the LUE is mildly increased and mild-mod with distraction Abnormal movements: no significant tremor, even with distraction.   Coordination:  There is minor decremation with RAM's, seen mostly with finger taps on the right Gait and Station: The patient has no difficulty arising out of a deep-seated chair without the use of the hands. The patient's stride length is normal but there is decreased arm swing on the left.  The patient has a negative pull test.      ASSESSMENT/PLAN:  1.  Idiopathic Parkinson's disease, diagnosed in December, 2015  -I looked at the media clip (6 minutes) at the patient asked me to review with him in the room.  I explained to the patient that this supplement (protandim) in no way has any evidence that it slows Parkinson's disease.  I discussed this supplement with him in the past, but spent greater than 50% of the 30 minute visit in counseling about this supplement today.  In addition, we discussed whether or not to start FDA approved medications for Parkinson's, such as one of the dopamine agonists.  He is fairly rigid, and I told him that I would recommend that he take something.  He is not interested.  As last time, he again mentioned that he has been in contact with a  company in [place] regarding stem cell transplant.  It was my recommendation that he avoid going to [place] for any treatment such as this.  -I again stressed to him about the importance of safe, cardiovascular exercise.  -He asked me about leaving work through disability, and I told him that I would support whatever decision that he made in that regard.   2.  I will see the patient back in 6 months, but I am happy to see him before that if new neurologic issues arise.

## 2015-03-02 ENCOUNTER — Ambulatory Visit: Payer: Self-pay | Admitting: Neurology

## 2015-03-03 ENCOUNTER — Ambulatory Visit: Payer: Self-pay | Admitting: Neurology

## 2015-03-30 ENCOUNTER — Ambulatory Visit: Payer: 59 | Admitting: Neurology

## 2015-04-10 ENCOUNTER — Telehealth: Payer: Self-pay | Admitting: Family Medicine

## 2015-04-10 ENCOUNTER — Ambulatory Visit (INDEPENDENT_AMBULATORY_CARE_PROVIDER_SITE_OTHER): Payer: 59 | Admitting: Family Medicine

## 2015-04-10 ENCOUNTER — Encounter: Payer: Self-pay | Admitting: Family Medicine

## 2015-04-10 VITALS — BP 103/68 | HR 60 | Temp 97.2°F | Ht 68.0 in | Wt 156.0 lb

## 2015-04-10 DIAGNOSIS — K4090 Unilateral inguinal hernia, without obstruction or gangrene, not specified as recurrent: Secondary | ICD-10-CM | POA: Diagnosis not present

## 2015-04-10 NOTE — Progress Notes (Signed)
   Subjective:    Patient ID: Aaron Robertson, male    DOB: Aug 20, 1956, 59 y.o.   MRN: [id number]  HPI Patient here today for possible hernia. There is a knot to the right side of his lower abdomen. He has just noticed this over the past week. His daily activities include moving lifting pushing pulling etc. and he does not recall any specific activity when this started bothering him.       Patient Active Problem List   Diagnosis Date Noted  . Hyperlipidemia 09/29/2014  . Parkinson's disease 09/27/2014  . Other fatigue 08/22/2014  . Tremors of nervous system 08/22/2014  . Abnormality of gait 08/22/2014  . BPH (benign prostatic hyperplasia) 08/22/2014  . Movement disorder 08/22/2014   Outpatient Encounter Prescriptions as of 04/10/2015  Medication Sig  . OVER THE COUNTER MEDICATION Protandim - 1 daily   No facility-administered encounter medications on file as of 04/10/2015.     Review of Systems  Constitutional: Negative.   HENT: Negative.   Eyes: Negative.   Respiratory: Negative.   Cardiovascular: Negative.   Gastrointestinal:       Possible hernia  Endocrine: Negative.   Genitourinary: Negative.   Musculoskeletal: Negative.   Skin: Negative.   Allergic/Immunologic: Negative.   Neurological: Negative.   Hematological: Negative.   Psychiatric/Behavioral: Negative.        Objective:   Physical Exam  Abdominal: Soft. He exhibits distension. There is no tenderness. There is no rebound and no guarding.  Bulging right inguinal hernia that reduces when the patient is supine  Genitourinary: Penis normal.  When the patient is supine there is no bulging. When he stands there is bulging on the right side. When supine if the tightens up his stomach muscles there is bulging again. It is easily reducible.  Musculoskeletal: Normal range of motion.  Neurological: He is alert.  Skin: Skin is warm and dry. No rash noted.  Psychiatric: He has a normal mood and affect. His  behavior is normal. Judgment and thought content normal.   BP 103/68 mmHg  Pulse 60  Temp(Src) 97.2 F (36.2 C) (Oral)  Ht 5\' 8"  (1.727 m)  Wt 156 lb (70.761 kg)  BMI 23.73 kg/m2        Assessment & Plan:  1. Right inguinal hernia -We will refer to general surgeon for further evaluation and recommendation about repair  Patient Instructions  Avoid heavy lifting pushing pulling etc. If the pain becomes severe you should go to the emergency room We will do a referral to the general surgeon to further evaluate this and he can recommend the expediency of getting it repaired   Arrie Senate MD

## 2015-04-10 NOTE — Addendum Note (Signed)
Addended by: Ilean China on: 04/10/2015 02:39 PM   Modules accepted: Orders

## 2015-04-10 NOTE — Telephone Encounter (Signed)
This depends upon the complications that he finds with doing the surgery and the kind of work that you are doing and that will have to be decided by the surgeon

## 2015-04-10 NOTE — Patient Instructions (Signed)
Avoid heavy lifting pushing pulling etc. If the pain becomes severe you should go to the emergency room We will do a referral to the general surgeon to further evaluate this and he can recommend the expediency of getting it repaired

## 2015-04-11 NOTE — Telephone Encounter (Signed)
Patient aware.

## 2015-04-12 ENCOUNTER — Telehealth: Payer: Self-pay | Admitting: Family Medicine

## 2015-04-12 NOTE — Telephone Encounter (Signed)
Pt notified of appt Verbalizes understanding

## 2015-04-14 DIAGNOSIS — K4090 Unilateral inguinal hernia, without obstruction or gangrene, not specified as recurrent: Secondary | ICD-10-CM

## 2015-04-14 HISTORY — DX: Unilateral inguinal hernia, without obstruction or gangrene, not specified as recurrent: K40.90

## 2015-04-20 ENCOUNTER — Encounter: Payer: Self-pay | Admitting: *Deleted

## 2015-05-02 ENCOUNTER — Other Ambulatory Visit: Payer: Self-pay | Admitting: General Surgery

## 2015-05-09 ENCOUNTER — Encounter (HOSPITAL_BASED_OUTPATIENT_CLINIC_OR_DEPARTMENT_OTHER): Payer: Self-pay | Admitting: *Deleted

## 2015-05-09 NOTE — Pre-Procedure Instructions (Signed)
To come for CMET and CBC, diff

## 2015-05-10 ENCOUNTER — Encounter (HOSPITAL_BASED_OUTPATIENT_CLINIC_OR_DEPARTMENT_OTHER)
Admission: RE | Admit: 2015-05-10 | Discharge: 2015-05-10 | Disposition: A | Discharge status: Home / Self Care | Payer: 59 | Source: Ambulatory Visit | Attending: General Surgery | Admitting: General Surgery

## 2015-05-10 DIAGNOSIS — G20 Parkinson's disease: Secondary | ICD-10-CM | POA: Diagnosis not present

## 2015-05-10 DIAGNOSIS — E785 Hyperlipidemia, unspecified: Secondary | ICD-10-CM | POA: Diagnosis not present

## 2015-05-10 DIAGNOSIS — K4090 Unilateral inguinal hernia, without obstruction or gangrene, not specified as recurrent: Secondary | ICD-10-CM | POA: Diagnosis not present

## 2015-05-10 DIAGNOSIS — N400 Enlarged prostate without lower urinary tract symptoms: Secondary | ICD-10-CM | POA: Diagnosis not present

## 2015-05-10 LAB — COMPREHENSIVE METABOLIC PANEL
ALBUMIN: 4 g/dL (ref 3.5–5.0)
ALK PHOS: 48 U/L (ref 38–126)
ALT: 10 U/L — ABNORMAL LOW (ref 17–63)
ANION GAP: 5 (ref 5–15)
AST: 20 U/L (ref 15–41)
BUN: 14 mg/dL (ref 6–20)
CO2: 30 mmol/L (ref 22–32)
CREATININE: 1.03 mg/dL (ref 0.61–1.24)
Calcium: 9.1 mg/dL (ref 8.9–10.3)
Chloride: 102 mmol/L (ref 101–111)
GFR calc Af Amer: >60 mL/min (ref >60)
Glucose, Bld: 100 mg/dL — ABNORMAL HIGH (ref 65–99)
POTASSIUM: 4.5 mmol/L (ref 3.5–5.1)
Sodium: 137 mmol/L (ref 135–145)
Total Bilirubin: 1 mg/dL (ref 0.3–1.2)
Total Protein: 6.8 g/dL (ref 6.5–8.1)

## 2015-05-10 LAB — CBC WITH DIFFERENTIAL/PLATELET
BASOS ABS: 0 10*3/uL (ref 0.0–0.1)
Basophils Relative: 0 % (ref 0–1)
Eosinophils Absolute: 0 10*3/uL (ref 0.0–0.7)
Eosinophils Relative: 0 % (ref 0–5)
HCT: 41.9 % (ref 39.0–52.0)
Hemoglobin: 14.2 g/dL (ref 13.0–17.0)
LYMPHS ABS: 1.4 10*3/uL (ref 0.7–4.0)
LYMPHS PCT: 30 % (ref 12–46)
MCH: 31 pg (ref 26.0–34.0)
MCHC: 33.9 g/dL (ref 30.0–36.0)
MCV: 91.5 fL (ref 78.0–100.0)
Monocytes Absolute: 0.5 10*3/uL (ref 0.1–1.0)
Monocytes Relative: 10 % (ref 3–12)
NEUTROS ABS: 2.9 10*3/uL (ref 1.7–7.7)
NEUTROS PCT: 60 % (ref 43–77)
Platelets: 164 10*3/uL (ref 150–400)
RBC: 4.58 MIL/uL (ref 4.22–5.81)
RDW: 12.9 % (ref 11.5–15.5)
WBC: 4.8 10*3/uL (ref 4.0–10.5)

## 2015-05-12 NOTE — H&P (Signed)
Aaron Robertson. Aaron Robertson  Location: [facility] Patient #: [id number] DOB: 18-Apr-1956 Married / Language: Undefined / Race: Undefined Male       History of Present Illness    The patient is a 59 year old male who presents with an inguinal hernia. This is a 59 year old Caucasian male, referred by Dr. Morrie Sheldon in [place], [place] for evaluation of a right inguinal hernia. The patient has noticed a bulge in his right groin recently. No pain. Reducible. 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 There is no light duty. No prior history of hernia. No history of abdominal surgery. Comorbidities include Parkinson's disease disease which affects his voice and slight intention tremor but on no prescription medications. Mild gait disorder. BPH but voids normally. Hyperlipidemia. He is married. Has 2 children. Works at the [employer] as above. 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 Denies tobacco. Alcohol rare. He wants to go ahead and get his hernia repaired so he can get back to work. We talked about laparoscopic and open repair, the pros and cons of each. He selected open repair. He will be scheduled for open repair of right inguinal hernia with mesh as an outpatient. We have discussed the indications, details, techniques and numerous risk of the surgery with him. He is aware of the risk of bleeding, infection, nerve damage with chronic pain, recurrence of the hernia, rare injury to adjacent organs with major reconstructive surgery. He understands all of these issues. All of his questions were answered. He agrees with this plan.   Other Problems Back Pain Hemorrhoids  Past Surgical History No pertinent past surgical history  Diagnostic Studies History  Colonoscopy 5-10 years ago  Allergies   No Known Drug Allergies07/14/2016  Medication History No Current Medications Medications Reconciled  Social History  Alcohol use Occasional alcohol use. Caffeine use Tea. No drug use Tobacco use Never smoker.  Family History Cerebrovascular Accident Father. Colon Polyps Father. Hypertension Father.  Review of Systems  General Not Present- Appetite Loss, Chills, Fatigue, Fever, Night Sweats, Weight Gain and Weight Loss. Skin Not Present- Change in Wart/Mole, Dryness, Hives, Jaundice, New Lesions, Non-Healing Wounds, Rash and Ulcer. HEENT Not Present- Earache, Hearing Loss, Hoarseness, Nose Bleed, Oral Ulcers, Ringing in the Ears, Seasonal Allergies, Sinus Pain, Sore Throat, Visual Disturbances, Wears glasses/contact lenses and Yellow Eyes. Respiratory Not Present- Bloody sputum, Chronic Cough, Difficulty Breathing, Snoring and Wheezing. Breast Not Present- Breast Mass, Breast Pain, Nipple Discharge and Skin Changes. Cardiovascular Not Present- Chest Pain, Difficulty Breathing Lying Down, Leg Cramps, Palpitations, Rapid Heart Rate, Shortness of Breath and Swelling of Extremities. Gastrointestinal Not Present- Abdominal Pain, Bloating, Bloody Stool, Change in Bowel Habits, Chronic diarrhea, Constipation, Difficulty Swallowing, Excessive gas, Gets full quickly at meals, Hemorrhoids, Indigestion, Nausea, Rectal Pain and Vomiting. Male Genitourinary Not Present- Blood in Urine, Change in Urinary Stream, Frequency, Impotence, Nocturia, Painful Urination, Urgency and Urine Leakage. Musculoskeletal Not Present- Back Pain, Joint Pain, Joint Stiffness, Muscle Pain, Muscle Weakness and Swelling of Extremities. Neurological Present- Tremor and Trouble walking. Not Present- Decreased Memory, Fainting, Headaches, Numbness, Seizures, Tingling and Weakness. Psychiatric Not Present- Anxiety, Bipolar, Change in Sleep Pattern, Depression, Fearful and Frequent crying. Endocrine Not Present- Cold  Intolerance, Excessive Hunger, Hair Changes, Heat Intolerance, Hot flashes and New Diabetes.  Hematology Not Present- Easy Bruising, Excessive bleeding, Gland problems, HIV and Persistent Infections.   Vitals   Weight: 155 lb Height: 68in Body Surface Area: 1.84 m Body Mass Index: 23.57 kg/m Temp.: 47F(Temporal)  Pulse: 64 (Regular)  BP: 122/80 (Sitting, Left Arm, Standard)    Physical Exam  General Mental Status-Alert. General Appearance-Consistent with stated age. Hydration-Well hydrated. Voice-Normal.  Head and Neck Head-normocephalic, atraumatic with no lesions or palpable masses. Trachea-midline. Thyroid Gland Characteristics - normal size and consistency.  Eye Eyeball - Bilateral-Extraocular movements intact. Sclera/Conjunctiva - Bilateral-No scleral icterus.  Chest and Lung Exam Chest and lung exam reveals -quiet, even and easy respiratory effort with no use of accessory muscles and on auscultation, normal breath sounds, no adventitious sounds and normal vocal resonance. Inspection Chest Wall - Normal. Back - normal.  Cardiovascular Cardiovascular examination reveals -normal heart sounds, regular rate and rhythm with no murmurs and normal pedal pulses bilaterally.  Abdomen Inspection Inspection of the abdomen reveals - No Hernias. Skin - Scar - no surgical scars. Palpation/Percussion Palpation and Percussion of the abdomen reveal - Soft, Non Tender, No Rebound tenderness, No Rigidity (guarding) and No hepatosplenomegaly. Auscultation Auscultation of the abdomen reveals - Bowel sounds normal.  Male Genitourinary Note: He has a small to medium size right inguinal hernia, easily reducible. No hernia on the left. Umbilicus normal. Abdomen soft. Penis scrotum and testes are normal.   Neurologic Neurologic evaluation reveals -alert and oriented x 3 with no impairment of recent or remote memory. Mental  Status-Normal.  Musculoskeletal Normal Exam - Left-Upper Extremity Strength Normal and Lower Extremity Strength Normal. Normal Exam - Right-Upper Extremity Strength Normal and Lower Extremity Strength Normal.  Lymphatic Head & Neck  General Head & Neck Lymphatics: Bilateral - Description - Normal. Axillary  General Axillary Region: Bilateral - Description - Normal. Tenderness - Non Tender. Femoral & Inguinal  Generalized Femoral & Inguinal Lymphatics: Bilateral - Description - Normal. Tenderness - Non Tender.    Assessment & Plan   RIGHT INGUINAL HERNIA (550.90  K40.90)  Schedule for Surgery You have a small to medium size right inguinal hernia that is reducible. You state that this has been progressively enlarging. You state that your employer has taken you out of work until this is repaired because you do strenuous work You will be scheduled for open repair of right inguinal hernia with mesh in the near future. We have discussed the techniques and risks of this surgery in detail Please read the printed information that I gave you.  Pt Education - Groin (Inguinal) Hernia Repair: inguinal hernia repair  PARKINSON DISEASE, SYMPTOMATIC (332.0  G20) HYPERLIPIDEMIA, MILD (272.4  E78.5) HISTORY OF BPH (M54.65  K35.465)    Edsel Petrin. Dalbert Batman, M.D., [facility] General and Minimally invasive Surgery Breast and Colorectal Surgery Office:   [phone] Pager:   [phone]

## 2015-05-15 ENCOUNTER — Ambulatory Visit (HOSPITAL_BASED_OUTPATIENT_CLINIC_OR_DEPARTMENT_OTHER): Payer: 59 | Admitting: Anesthesiology

## 2015-05-15 ENCOUNTER — Ambulatory Visit (HOSPITAL_BASED_OUTPATIENT_CLINIC_OR_DEPARTMENT_OTHER)
Admission: RE | Admit: 2015-05-15 | Discharge: 2015-05-15 | Disposition: A | Discharge status: Home / Self Care | Payer: 59 | Source: Ambulatory Visit | Attending: General Surgery | Admitting: General Surgery

## 2015-05-15 ENCOUNTER — Encounter (HOSPITAL_BASED_OUTPATIENT_CLINIC_OR_DEPARTMENT_OTHER)
Admission: RE | Disposition: A | Discharge status: Home / Self Care | Payer: Self-pay | Source: Ambulatory Visit | Attending: General Surgery

## 2015-05-15 ENCOUNTER — Encounter (HOSPITAL_BASED_OUTPATIENT_CLINIC_OR_DEPARTMENT_OTHER): Payer: Self-pay

## 2015-05-15 DIAGNOSIS — G20 Parkinson's disease: Secondary | ICD-10-CM | POA: Insufficient documentation

## 2015-05-15 DIAGNOSIS — N400 Enlarged prostate without lower urinary tract symptoms: Secondary | ICD-10-CM | POA: Insufficient documentation

## 2015-05-15 DIAGNOSIS — E785 Hyperlipidemia, unspecified: Secondary | ICD-10-CM | POA: Insufficient documentation

## 2015-05-15 DIAGNOSIS — K4090 Unilateral inguinal hernia, without obstruction or gangrene, not specified as recurrent: Secondary | ICD-10-CM | POA: Insufficient documentation

## 2015-05-15 HISTORY — PX: INGUINAL HERNIA REPAIR: SHX194

## 2015-05-15 HISTORY — PX: INSERTION OF MESH: SHX5868

## 2015-05-15 HISTORY — DX: Unilateral inguinal hernia, without obstruction or gangrene, not specified as recurrent: K40.90

## 2015-05-15 SURGERY — REPAIR, HERNIA, INGUINAL, ADULT
Anesthesia: Regional | Site: Groin | Laterality: Right

## 2015-05-15 MED ORDER — PROPOFOL 10 MG/ML IV BOLUS
INTRAVENOUS | Status: DC | PRN
  Administered 2015-05-15: 100 mg via INTRAVENOUS
  Administered 2015-05-15: 50 mg via INTRAVENOUS

## 2015-05-15 MED ORDER — SCOPOLAMINE 1 MG/3DAYS TD PT72: 1.0000 | MEDICATED_PATCH | Freq: Once | TRANSDERMAL | Status: DC | PRN

## 2015-05-15 MED ORDER — MIDAZOLAM HCL 2 MG/2ML IJ SOLN
1.0000 mg | INTRAMUSCULAR | Status: DC | PRN
  Administered 2015-05-15 (×2): 1 mg via INTRAVENOUS

## 2015-05-15 MED ORDER — DEXAMETHASONE SODIUM PHOSPHATE 4 MG/ML IJ SOLN
INTRAMUSCULAR | Status: DC | PRN
  Administered 2015-05-15: 10 mg via INTRAVENOUS

## 2015-05-15 MED ORDER — ONDANSETRON HCL 4 MG/2ML IJ SOLN: 4.0000 mg | Freq: Once | INTRAMUSCULAR | Status: DC | PRN

## 2015-05-15 MED ORDER — MIDAZOLAM HCL 2 MG/2ML IJ SOLN
INTRAMUSCULAR | Status: AC
  Filled 2015-05-15: qty 2

## 2015-05-15 MED ORDER — FENTANYL CITRATE (PF) 100 MCG/2ML IJ SOLN
50.0000 ug | INTRAMUSCULAR | Status: DC | PRN
  Administered 2015-05-15 (×2): 50 ug via INTRAVENOUS

## 2015-05-15 MED ORDER — ACETAMINOPHEN 650 MG RE SUPP: 650.0000 mg | RECTAL | Status: DC | PRN

## 2015-05-15 MED ORDER — OXYCODONE-ACETAMINOPHEN 7.5-325 MG PO TABS: 1.0000 | ORAL_TABLET | ORAL | Status: DC | PRN

## 2015-05-15 MED ORDER — LACTATED RINGERS IV SOLN
INTRAVENOUS | Status: DC
  Administered 2015-05-15 (×2): via INTRAVENOUS

## 2015-05-15 MED ORDER — SODIUM CHLORIDE 0.9 % IJ SOLN: 3.0000 mL | Freq: Two times a day (BID) | INTRAMUSCULAR | Status: DC

## 2015-05-15 MED ORDER — SUCCINYLCHOLINE CHLORIDE 20 MG/ML IJ SOLN
INTRAMUSCULAR | Status: DC | PRN
  Administered 2015-05-15: 100 mg via INTRAVENOUS

## 2015-05-15 MED ORDER — ONDANSETRON HCL 4 MG/2ML IJ SOLN
INTRAMUSCULAR | Status: DC | PRN
  Administered 2015-05-15: 4 mg via INTRAVENOUS

## 2015-05-15 MED ORDER — KETOROLAC TROMETHAMINE 30 MG/ML IJ SOLN: 30.0000 mg | Freq: Once | INTRAMUSCULAR | Status: DC | PRN

## 2015-05-15 MED ORDER — BUPIVACAINE-EPINEPHRINE 0.5% -1:200000 IJ SOLN
INTRAMUSCULAR | Status: DC | PRN
Start: 2015-05-15 — End: 2015-05-15
  Administered 2015-05-15: 7 mL

## 2015-05-15 MED ORDER — GLYCOPYRROLATE 0.2 MG/ML IJ SOLN: 0.2000 mg | Freq: Once | INTRAMUSCULAR | Status: DC | PRN

## 2015-05-15 MED ORDER — LIDOCAINE HCL (CARDIAC) 20 MG/ML IV SOLN
INTRAVENOUS | Status: DC | PRN
  Administered 2015-05-15: 60 mg via INTRAVENOUS

## 2015-05-15 MED ORDER — CEFAZOLIN SODIUM-DEXTROSE 2-3 GM-% IV SOLR
INTRAVENOUS | Status: AC
  Filled 2015-05-15: qty 50

## 2015-05-15 MED ORDER — FENTANYL CITRATE (PF) 100 MCG/2ML IJ SOLN
INTRAMUSCULAR | Status: AC
  Filled 2015-05-15: qty 2

## 2015-05-15 MED ORDER — FENTANYL CITRATE (PF) 100 MCG/2ML IJ SOLN: 25.0000 ug | INTRAMUSCULAR | Status: DC | PRN

## 2015-05-15 MED ORDER — SODIUM CHLORIDE 0.9 % IJ SOLN: 3.0000 mL | INTRAMUSCULAR | Status: DC | PRN

## 2015-05-15 MED ORDER — OXYCODONE HCL 5 MG PO TABS: 5.0000 mg | ORAL_TABLET | ORAL | Status: DC | PRN

## 2015-05-15 MED ORDER — SODIUM CHLORIDE 0.9 % IV SOLN: INTRAVENOUS | Status: DC

## 2015-05-15 MED ORDER — ACETAMINOPHEN 325 MG PO TABS: 650.0000 mg | ORAL_TABLET | ORAL | Status: DC | PRN

## 2015-05-15 MED ORDER — PROPOFOL 10 MG/ML IV BOLUS
INTRAVENOUS | Status: AC
  Filled 2015-05-15: qty 20

## 2015-05-15 MED ORDER — CEFAZOLIN SODIUM-DEXTROSE 2-3 GM-% IV SOLR
2.0000 g | INTRAVENOUS | Status: AC
  Administered 2015-05-15: 2 g via INTRAVENOUS

## 2015-05-15 MED ORDER — LIDOCAINE-EPINEPHRINE (PF) 1 %-1:200000 IJ SOLN
INTRAMUSCULAR | Status: AC
  Filled 2015-05-15: qty 10

## 2015-05-15 MED ORDER — BUPIVACAINE-EPINEPHRINE (PF) 0.5% -1:200000 IJ SOLN
INTRAMUSCULAR | Status: AC
  Filled 2015-05-15: qty 30

## 2015-05-15 MED ORDER — SODIUM BICARBONATE 4 % IV SOLN
INTRAVENOUS | Status: AC
  Filled 2015-05-15: qty 5

## 2015-05-15 MED ORDER — CHLORHEXIDINE GLUCONATE 4 % EX LIQD: 1.0000 "application " | Freq: Once | CUTANEOUS | Status: DC

## 2015-05-15 MED ORDER — SODIUM CHLORIDE 0.9 % IV SOLN: 250.0000 mL | INTRAVENOUS | Status: DC | PRN

## 2015-05-15 MED ORDER — BUPIVACAINE-EPINEPHRINE (PF) 0.5% -1:200000 IJ SOLN
INTRAMUSCULAR | Status: DC | PRN
  Administered 2015-05-15: 25 mL

## 2015-05-15 MED ORDER — FENTANYL CITRATE (PF) 100 MCG/2ML IJ SOLN
INTRAMUSCULAR | Status: AC
  Filled 2015-05-15: qty 6

## 2015-05-15 SURGICAL SUPPLY — 59 items
ADH SKN CLS APL DERMABOND .7 (GAUZE/BANDAGES/DRESSINGS) ×2
APL SKNCLS STERI-STRIP NONHPOA (GAUZE/BANDAGES/DRESSINGS)
BENZOIN TINCTURE PRP APPL 2/3 (GAUZE/BANDAGES/DRESSINGS) IMPLANT
BLADE CLIPPER SURG (BLADE) ×1 IMPLANT
BLADE HEX COATED 2.75 (ELECTRODE) ×3 IMPLANT
BLADE SURG 10 STRL SS (BLADE) ×3 IMPLANT
CANISTER SUCT 1200ML W/VALVE (MISCELLANEOUS) ×3 IMPLANT
CHLORAPREP W/TINT 26ML (MISCELLANEOUS) ×3 IMPLANT
COVER BACK TABLE 60X90IN (DRAPES) ×6 IMPLANT
COVER MAYO STAND STRL (DRAPES) ×3 IMPLANT
DECANTER SPIKE VIAL GLASS SM (MISCELLANEOUS) IMPLANT
DERMABOND ADVANCED (GAUZE/BANDAGES/DRESSINGS) ×1
DERMABOND ADVANCED .7 DNX12 (GAUZE/BANDAGES/DRESSINGS) IMPLANT
DRAIN PENROSE 1/2X12 LTX STRL (WOUND CARE) ×3 IMPLANT
DRAPE LAPAROTOMY 100X72 PEDS (DRAPES) ×3 IMPLANT
DRAPE LAPAROTOMY TRNSV 102X78 (DRAPE) IMPLANT
DRAPE UTILITY XL STRL (DRAPES) ×3 IMPLANT
ELECT REM PT RETURN 9FT ADLT (ELECTROSURGICAL) ×3
ELECTRODE REM PT RTRN 9FT ADLT (ELECTROSURGICAL) ×2 IMPLANT
GLOVE EUDERMIC 7 POWDERFREE (GLOVE) ×3 IMPLANT
GOWN STRL REUS W/ TWL LRG LVL3 (GOWN DISPOSABLE) ×2 IMPLANT
GOWN STRL REUS W/ TWL XL LVL3 (GOWN DISPOSABLE) ×2 IMPLANT
GOWN STRL REUS W/TWL LRG LVL3 (GOWN DISPOSABLE) ×3
GOWN STRL REUS W/TWL XL LVL3 (GOWN DISPOSABLE) ×3
MESH ULTRAPRO 3X6 7.6X15CM (Mesh General) ×1 IMPLANT
NDL HYPO 25X1 1.5 SAFETY (NEEDLE) ×2 IMPLANT
NEEDLE HYPO 22GX1.5 SAFETY (NEEDLE) ×3 IMPLANT
NEEDLE HYPO 25X1 1.5 SAFETY (NEEDLE) ×3 IMPLANT
NS IRRIG 1000ML POUR BTL (IV SOLUTION) ×3 IMPLANT
PACK BASIN DAY SURGERY FS (CUSTOM PROCEDURE TRAY) ×3 IMPLANT
PENCIL BUTTON HOLSTER BLD 10FT (ELECTRODE) ×3 IMPLANT
SLEEVE SCD COMPRESS KNEE MED (MISCELLANEOUS) ×1 IMPLANT
SPONGE GAUZE 4X4 12PLY STER LF (GAUZE/BANDAGES/DRESSINGS) IMPLANT
SPONGE LAP 4X18 X RAY DECT (DISPOSABLE) ×1 IMPLANT
STAPLER VISISTAT 35W (STAPLE) IMPLANT
STRIP CLOSURE SKIN 1/2X4 (GAUZE/BANDAGES/DRESSINGS) IMPLANT
SUT MNCRL AB 4-0 PS2 18 (SUTURE) ×3 IMPLANT
SUT PROLENE 1 CT (SUTURE) IMPLANT
SUT PROLENE 2 0 CT2 30 (SUTURE) ×7 IMPLANT
SUT SILK 2 0 SH (SUTURE) ×1 IMPLANT
SUT SILK 2 0 TIES 17X18 (SUTURE) ×3
SUT SILK 2-0 18XBRD TIE BLK (SUTURE) ×2 IMPLANT
SUT SILK 3 0 SH 30 (SUTURE) IMPLANT
SUT VIC AB 2-0 CT1 27 (SUTURE)
SUT VIC AB 2-0 CT1 TAPERPNT 27 (SUTURE) IMPLANT
SUT VIC AB 2-0 SH 27 (SUTURE) ×6
SUT VIC AB 2-0 SH 27XBRD (SUTURE) ×2 IMPLANT
SUT VIC AB 3-0 54X BRD REEL (SUTURE) IMPLANT
SUT VIC AB 3-0 BRD 54 (SUTURE) ×3
SUT VIC AB 3-0 FS2 27 (SUTURE) IMPLANT
SUT VIC AB 3-0 SH 27 (SUTURE) ×3
SUT VIC AB 3-0 SH 27X BRD (SUTURE) ×2 IMPLANT
SUT VICRYL 3-0 CR8 SH (SUTURE) IMPLANT
SYR 20CC LL (SYRINGE) ×3 IMPLANT
SYRINGE 10CC LL (SYRINGE) ×3 IMPLANT
TOWEL OR NON WOVEN STRL DISP B (DISPOSABLE) ×3 IMPLANT
TRAY DSU PREP LF (CUSTOM PROCEDURE TRAY) ×3 IMPLANT
TUBE CONNECTING 20X1/4 (TUBING) ×3 IMPLANT
YANKAUER SUCT BULB TIP NO VENT (SUCTIONS) ×3 IMPLANT

## 2015-05-15 NOTE — Anesthesia Preprocedure Evaluation (Addendum)
Anesthesia Evaluation  Patient identified by MRN, date of birth, ID band Patient awake    Reviewed: Allergy & Precautions, NPO status , Patient's Chart, lab work & pertinent test results  Airway Mallampati: I  TM Distance: >3 FB Neck ROM: Full    Dental   Pulmonary neg pulmonary ROS,  breath sounds clear to auscultation        Cardiovascular negative cardio ROS  Rhythm:Regular Rate:Normal     Neuro/Psych Parkinsons    GI/Hepatic negative GI ROS, Neg liver ROS,   Endo/Other  negative endocrine ROS  Renal/GU negative Renal ROS     Musculoskeletal   Abdominal   Peds  Hematology negative hematology ROS (+)   Anesthesia Other Findings   Reproductive/Obstetrics                            Anesthesia Physical Anesthesia Plan  ASA: II  Anesthesia Plan: General and Regional   Post-op Pain Management: GA combined w/ Regional for post-op pain   Induction: Intravenous  Airway Management Planned: Oral ETT and LMA  Additional Equipment:   Intra-op Plan:   Post-operative Plan: Extubation in OR  Informed Consent: I have reviewed the patients History and Physical, chart, labs and discussed the procedure including the risks, benefits and alternatives for the proposed anesthesia with the patient or authorized representative who has indicated his/her understanding and acceptance.   Dental advisory given  Plan Discussed with: CRNA  Anesthesia Plan Comments:         Anesthesia Quick Evaluation

## 2015-05-15 NOTE — Anesthesia Procedure Notes (Addendum)
Anesthesia Regional Block:  TAP block  Pre-Anesthetic Checklist: ,, timeout performed, Correct Patient, Correct Site, Correct Laterality, Correct Procedure, Correct Position, site marked, Risks and benefits discussed,  Surgical consent,  Pre-op evaluation,  At surgeon's request and post-op pain management  Laterality: Right  Prep: chloraprep       Needles:  Injection technique: Single-shot  Needle Type: Echogenic Needle     Needle Length: 9cm 9 cm Needle Gauge: 21 and 21 G    Additional Needles:  Procedures: ultrasound guided (picture in chart) TAP block Narrative:  Start time: 05/15/2015 8:55 AM End time: 05/15/2015 9:05 AM Injection made incrementally with aspirations every 5 mL.  Performed by: Personally  Anesthesiologist: Suzette Battiest  Additional Notes: Risks and benefits discussed. Pt tolerated well with no immediate complications.   Procedure Name: Intubation Date/Time: 05/15/2015 9:33 AM Performed by: Lyndee Leo Pre-anesthesia Checklist: Patient identified, Emergency Drugs available, Suction available and Patient being monitored Patient Re-evaluated:Patient Re-evaluated prior to inductionOxygen Delivery Method: Circle System Utilized Preoxygenation: Pre-oxygenation with 100% oxygen Intubation Type: IV induction Ventilation: Mask ventilation without difficulty Laryngoscope Size: Mac and 3 Grade View: Grade II Tube type: Oral Tube size: 7.0 mm Number of attempts: 1 Airway Equipment and Method: Stylet and Oral airway Placement Confirmation: ETT inserted through vocal cords under direct vision,  positive ETCO2 and breath sounds checked- equal and bilateral Secured at: 22 cm Tube secured with: Tape Dental Injury: Teeth and Oropharynx as per pre-operative assessment

## 2015-05-15 NOTE — Progress Notes (Signed)
AssistedDr. Rob Fitzgerald with right, ultrasound guided, transabdominal plane block. Side rails up, monitors on throughout procedure. See vital signs in flow sheet. Tolerated Procedure well.  

## 2015-05-15 NOTE — Interval H&P Note (Signed)
History and Physical Interval Note:  05/15/2015 8:55 AM  Aaron Robertson  has presented today for surgery, with the diagnosis of right inguinal hernia  The various methods of treatment have been discussed with the patient and family. After consideration of risks, benefits and other options for treatment, the patient has consented to  Procedure(s): OPEN RIGHT INGUINAL HERNIA REPAIR  (Right) INSERTION OF MESH (Right) as a surgical intervention .  The patient's history has been reviewed, patient examined, no change in status, stable for surgery.  I have reviewed the patient's chart and labs.  Questions were answered to the patient's satisfaction.     Adin Hector

## 2015-05-15 NOTE — Op Note (Signed)
Patient Name:           Aaron Robertson   Date of Surgery:        05/15/2015  Pre op Diagnosis:      Right inguinal hernia  Post op Diagnosis:    Right inguinal hernia, indirect type  Procedure:                 Open repair right inguinal hernia with mesh  Surgeon:                     Edsel Petrin. Dalbert Batman, M.D., FACS  Assistant:                      OR staff  Operative Indications:    This is a 59 year old Caucasian male, referred by Dr. Morrie Sheldon in [place], [place] for evaluation of a right inguinal hernia. The patient has noticed a bulge in his right groin recently. No pain. Reducible. 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 There is no light duty. No prior history of hernia. No history of abdominal surgery. Comorbidities include Parkinson's disease disease which affects his voice and slight intention tremor but on no prescription medications. Mild gait disorder. BPH but voids normally. Hyperlipidemia.  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 Denies tobacco. Alcohol rare. He wants to go ahead and get his hernia repaired so he can get back to work. We talked about laparoscopic and open repair, the pros and cons of each. He selected open repair. He will be scheduled for open repair of right inguinal hernia with mesh as an outpatient.   Operative Findings:       There was an indirect hernia sac somewhat anterior and somewhat lateral to the cord structures.  I opened the sac and found that it was empty.  I passed my finger easily into the abdominal cavity found no palpable abnormalities.  I searched carefully within the cord structures and there were no other abnormalities.  Procedure in Detail:          Following the induction of general endotracheal anesthesia the patient's lower abdomen right groin and genitalia were prepped  and draped in a sterile fashion.  Intravenous antibiotic given.  Surgical timeout was performed.  A Tapp block was performed by the anesthesiologist in the holding area.  0.5% Marcaine with epinephrine was used as local infiltration setting for the skin and subcutaneous  tissue.     A transverse incision was made in the right groin, overlying the inguinal canal.  Dissection was carried down, exposing the external oblique aponeurosis.  The external oblique was incised in the direction of its fibers, opening of the external inguinal ring.  The external oblique was dissected away from the underlying tissues and self-retaining retractors were placed.  There was a small sensory nerve intimately associated with cord structures.  This was traced back to its emergence from the muscles laterally, clamped, divided, and ligated with 2-0 silk tie.  The cord structures were mobilized and encircled with a Penrose drain and cremasteric muscle fibers were skeletonized.  I dissected the indirect sac away and examined it as described above.  Under direct vision I was able to twist the sac and then suture ligate the base with a  2-0 Vicryl suture ligature.  The redundant sac was excised.  The floor of the inguinal canal was repaired and reinforced with an onlay graft of ultra Pro mesh.  A 3" x 6" piece of ultra Pro mesh was brought to the operative field and trimmed at the corners to accommodate the anatomy of the wound.  The mesh was sutured in place with interrupted and running sutures of 2-0 proline.  The mesh was sutured so as to generously overlap the fascia at the pubic tubercle, then along the inguinal ligament inferiorly.  Medially, superiorly, and superiolaterally several mattress sutures of 2-0 proline were placed.  The mesh was incised laterally so as to wraparound the cord structures at the internal ring.  The tails of the mesh were overlapped laterally.  Further proline sutures were placed laterally.  This provided very  secure repair both medial and lateral to the internal ring but allowed an adequate fingertip opening for the cord structures.  The wound was irrigated with saline.  Hemostasis was excellent.      The external oblique was closed with a running suture of 2-0 Vicryl placing the cord structures deep to the external oblique.  Scarpa's fascia was closed with 3-0 Vicryl sutures and skin closed with a running subcuticular 4-0 Monocryl and Dermabond.  The patient tolerated the procedure well was taken to PACU in stable condition.  EBL 10 mL.  Counts correct.  Complications none.    Edsel Petrin. Dalbert Batman, M.D., FACS General and Minimally Invasive Surgery Breast and Colorectal Surgery  05/15/2015 10:29 AM

## 2015-05-15 NOTE — Discharge Instructions (Signed)
[facility] _______[facility] ° °UMBILICAL OR INGUINAL HERNIA REPAIR: POST OP INSTRUCTIONS ° °Always review your discharge instruction sheet given to you by the facility where your surgery was performed. °IF YOU HAVE DISABILITY OR FAMILY LEAVE FORMS, YOU MUST BRING THEM TO THE OFFICE FOR PROCESSING.   °DO NOT GIVE THEM TO YOUR DOCTOR. ° °1. A  prescription for pain medication may be given to you upon discharge.  Take your pain medication as prescribed, if needed.  If narcotic pain medicine is not needed, then you may take acetaminophen (Tylenol) or ibuprofen (Advil) as needed. °2. Take your usually prescribed medications unless otherwise directed. °3. If you need a refill on your pain medication, please contact your pharmacy.  They will contact our office to request authorization. Prescriptions will not be filled after 5 pm or on week-ends. °4. You should follow a light diet the first 24 hours after arrival home, such as soup and crackers, etc.  Be sure to include lots of fluids daily.  Resume your normal diet the day after surgery. °5. Most patients will experience some swelling and bruising around the umbilicus or in the groin and scrotum.  Ice packs and reclining will help.  Swelling and bruising can take several days to resolve.  °6. It is common to experience some constipation if taking pain medication after surgery.  Increasing fluid intake and taking a stool softener (such as Colace) will usually help or prevent this problem from occurring.  A mild laxative (Milk of Magnesia or Miralax) should be taken according to package directions if there are no bowel movements after 48 hours. °7. Unless discharge instructions indicate otherwise, you may remove your bandages 24-48 hours after surgery, and you may shower at that time.  You may have steri-strips (small skin tapes) in place directly over the incision.  These strips should be left on the skin for 7-10 days.  If your surgeon used skin glue on the  incision, you may shower in 24 hours.  The glue will flake off over the next 2-3 weeks.  Any sutures or staples will be removed at the office during your follow-up visit. °8. ACTIVITIES:  You may resume regular (light) daily activities beginning the next day--such as daily self-care, walking, climbing stairs--gradually increasing activities as tolerated.  You may have sexual intercourse when it is comfortable.  Refrain from any heavy lifting or straining until approved by your doctor. °a. You may drive when you are no longer taking prescription pain medication, you can comfortably wear a seatbelt, and you can safely maneuver your car and apply brakes. °b. RETURN TO WORK:  __________________________________________________________ °9. You should see your doctor in the office for a follow-up appointment approximately 2-3 weeks after your surgery.  Make sure that you call for this appointment within a day or two after you arrive home to insure a convenient appointment time. °10. OTHER INSTRUCTIONS:  __________________________________________________________________________________________________________________________________________________________________________________________  °WHEN TO CALL YOUR DOCTOR: °1. Fever over 101.0 °2. Inability to urinate °3. Nausea and/or vomiting °4. Extreme swelling or bruising °5. Continued bleeding from incision. °6. Increased pain, redness, or drainage from the incision ° °The clinic staff is available to answer your questions during regular business hours.  Please don’t hesitate to call and ask to speak to one of the nurses for clinical concerns.  If you have a medical emergency, go to the nearest emergency room or call 911.  A surgeon from [facility] is always on call at the hospital ° ° °  [address], [place], [place] [place]  [address] ?  [address], [place], [place]   [address] [phone] ? [phone] ? FAX [phone] Web site:  [email or web address]   Post Anesthesia Home Care Instructions  Activity: Get plenty of rest for the remainder of the day. A responsible adult should stay with you for 24 hours following the procedure.  For the next 24 hours, DO NOT: -Drive a car -Paediatric nurse -Drink alcoholic beverages -Take any medication unless instructed by your physician -Make any legal decisions or sign important papers.  Meals: Start with liquid foods such as gelatin or soup. Progress to regular foods as tolerated. Avoid greasy, spicy, heavy foods. If nausea and/or vomiting occur, drink only clear liquids until the nausea and/or vomiting subsides. Call your physician if vomiting continues.  Special Instructions/Symptoms: Your throat may feel dry or sore from the anesthesia or the breathing tube placed in your throat during surgery. If this causes discomfort, gargle with warm salt water. The discomfort should disappear within 24 hours.  If you had a scopolamine patch placed behind your ear for the management of post- operative nausea and/or vomiting:  1. The medication in the patch is effective for 72 hours, after which it should be removed.  Wrap patch in a tissue and discard in the trash. Wash hands thoroughly with soap and water. 2. You may remove the patch earlier than 72 hours if you experience unpleasant side effects which may include dry mouth, dizziness or visual disturbances. 3. Avoid touching the patch. Wash your hands with soap and water after contact with the patch.    Post Anesthesia Home Care Instructions  Activity: Get plenty of rest for the remainder of the day. A responsible adult should stay with you for 24 hours following the procedure.  For the next 24 hours, DO NOT: -Drive a car -Paediatric nurse -Drink alcoholic beverages -Take any medication unless instructed by your physician -Make any legal decisions or sign important papers.  Meals: Start with liquid foods such as  gelatin or soup. Progress to regular foods as tolerated. Avoid greasy, spicy, heavy foods. If nausea and/or vomiting occur, drink only clear liquids until the nausea and/or vomiting subsides. Call your physician if vomiting continues.  Special Instructions/Symptoms: Your throat may feel dry or sore from the anesthesia or the breathing tube placed in your throat during surgery. If this causes discomfort, gargle with warm salt water. The discomfort should disappear within 24 hours.  If you had a scopolamine patch placed behind your ear for the management of post- operative nausea and/or vomiting:  1. The medication in the patch is effective for 72 hours, after which it should be removed.  Wrap patch in a tissue and discard in the trash. Wash hands thoroughly with soap and water. 2. You may remove the patch earlier than 72 hours if you experience unpleasant side effects which may include dry mouth, dizziness or visual disturbances. 3. Avoid touching the patch. Wash your hands with soap and water after contact with the patch.

## 2015-05-15 NOTE — Transfer of Care (Signed)
Immediate Anesthesia Transfer of Care Note  Patient: Aaron Robertson  Procedure(s) Performed: Procedure(s): OPEN RIGHT INGUINAL HERNIA REPAIR  (Right) INSERTION OF MESH (Right)  Patient Location: PACU  Anesthesia Type:General  Level of Consciousness: sedated and lethargic  Airway & Oxygen Therapy: Patient Spontanous Breathing and Patient connected to face mask oxygen  Post-op Assessment: Report given to RN and Post -op Vital signs reviewed and stable  Post vital signs: Reviewed and stable  Last Vitals:  Filed Vitals:   05/15/15 0905  BP:   Pulse: 81  Temp:   Resp: 17    Complications: No apparent anesthesia complications

## 2015-05-15 NOTE — Anesthesia Postprocedure Evaluation (Signed)
  Anesthesia Post-op Note  Patient: Aaron Robertson  Procedure(s) Performed: Procedure(s): OPEN RIGHT INGUINAL HERNIA REPAIR  (Right) INSERTION OF MESH (Right)  Patient Location: PACU  Anesthesia Type:GA combined with regional for post-op pain  Level of Consciousness: awake, alert  and oriented  Airway and Oxygen Therapy: Patient Spontanous Breathing  Post-op Pain: none  Post-op Assessment: Post-op Vital signs reviewed              Post-op Vital Signs: Reviewed  Last Vitals:  Filed Vitals:   05/15/15 1145  BP: 142/80  Pulse: 63  Temp:   Resp: 16    Complications: No apparent anesthesia complications

## 2015-05-16 ENCOUNTER — Encounter (HOSPITAL_BASED_OUTPATIENT_CLINIC_OR_DEPARTMENT_OTHER): Payer: Self-pay | Admitting: General Surgery

## 2015-05-29 ENCOUNTER — Ambulatory Visit: Payer: 59 | Admitting: Family Medicine

## 2015-05-30 ENCOUNTER — Ambulatory Visit: Payer: 59 | Admitting: Family Medicine

## 2015-06-29 ENCOUNTER — Encounter: Payer: Self-pay | Admitting: Neurology

## 2015-06-29 ENCOUNTER — Ambulatory Visit (INDEPENDENT_AMBULATORY_CARE_PROVIDER_SITE_OTHER): Payer: 59 | Admitting: Neurology

## 2015-06-29 VITALS — BP 120/80 | HR 82 | Ht 68.0 in | Wt 153.0 lb

## 2015-06-29 DIAGNOSIS — G20 Parkinson's disease: Secondary | ICD-10-CM | POA: Diagnosis not present

## 2015-06-29 DIAGNOSIS — G20A1 Parkinson's disease without dyskinesia, without mention of fluctuations: Secondary | ICD-10-CM

## 2015-06-29 NOTE — Progress Notes (Signed)
Aaron Robertson was seen today in the movement disorders clinic for neurologic consultation at the request of [name], MD.  The consultation is for the evaluation of tremor, voice changes and gait change.   Sx's have been going on for 6 months.  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  He states that he would concentrate on it and it would go away.  It actually has been better over the last few weeks/months.  In fact, he states that most of the above sx's are better than they were a few months ago.  02/16/15 update:  The patient presents today for follow-up.  He was diagnosed with Parkinson's disease last visit, but refuses all medication for it, as he unfortunately believes that starting medication would speed up the degenerative process, even though no literature supports this.  He also states today that he just really doesn't feel he needs it.  He cannot tell that he is rigid and generally feels well most days.  Pt brings in an "extra" video clip that lasted a full 6 minutes that he asked me to look at with him regarding the "protandim" that he is now taking.  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  Occasionally rides on the bike.  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  Thinks that there may be a buyout this summer at work and thinks that he may take that if it is an option.  States that tremor is better.    06/29/15 update:  The patient returns today for follow-up.  He has a history of Parkinson's disease and has been fairly rigid, but refuses medications.  He denies hallucinations.  He denies falls.  He denies any near syncopal episodes since our last visit.  I did review records since last visit.  He underwent a right inguinal hernia repair on May 15, 2015.  States that he didn't go back to work.   He is noting more tremor especially when the hand is at rest and worries about that at work, as he is working with chemicals.  He is noting a loss of voice.    Neuroimaging has not previously been performed.   PREVIOUS MEDICATIONS: none to date  ALLERGIES:  No Known Allergies  CURRENT MEDICATIONS:  Outpatient Encounter Prescriptions as of 06/29/2015  Medication Sig  . OVER THE COUNTER MEDICATION Take by mouth. Protandim - 1 daily; takes 2 tabs. in AM, 1 after lunch and 1 after dinner  . [DISCONTINUED] oxyCODONE-acetaminophen (PERCOCET) 7.5-325 MG per tablet Take 1 tablet by mouth every 4 (four) hours as needed for severe pain.   No facility-administered encounter medications on file as of 06/29/2015.    PAST MEDICAL HISTORY:   Past Medical History  Diagnosis Date  . Parkinson's disease   . Inguinal hernia 04/2015    right    PAST SURGICAL HISTORY:   Past Surgical History  Procedure Laterality Date  . Colonoscopy    . Inguinal hernia repair Right 05/15/2015    Procedure: OPEN RIGHT INGUINAL HERNIA REPAIR ;  Surgeon: Fanny Skates, MD;  Location: [facility];  Service: General;  Laterality: Right;  . Insertion of mesh Right 05/15/2015    Procedure: INSERTION OF MESH;  Surgeon: Fanny Skates, MD;  Location: [facility];  Service: General;  Laterality: Right;    SOCIAL HISTORY:   Social History   Social History  . Marital Status: Married    Spouse Name: N/A  . Number of Children: N/A  . Years of Education: N/A   Occupational History  . Not on file.   Social History Main Topics  . Smoking status: Never Smoker   . Smokeless tobacco: Never Used  . Alcohol Use: 0.0 oz/week    0 Standard drinks or equivalent per week     Comment: occasionally  . Drug Use: No  . Sexual Activity: Not on file   Other Topics Concern  . Not on file   Social History Narrative    FAMILY HISTORY:   Family Status  Relation Status Death Age  . Mother Alive      heart disease  . Maternal Uncle Deceased   . Father Deceased     alzheimer's   . Paternal Grandmother Deceased     ALS  . Sister Alive     hyperlipidemia  . Sister Alive     hyperlipidemia  . Son Alive     healthy  . Daughter Alive     healthy    ROS:  A complete 10 system review of systems was obtained and was unremarkable apart from what is mentioned above.  PHYSICAL EXAMINATION:    VITALS:   Filed Vitals:   06/29/15 1412  BP: 120/80  Pulse: 82  Height: 5\' 8"  (1.727 m)  Weight: 153 lb (69.4 kg)    GEN:  The patient appears stated age and is in NAD. HEENT:  Normocephalic, atraumatic.  The mucous membranes are moist. The superficial temporal arteries are without ropiness or tenderness. CV:  RRR Lungs:  CTAB Neck/HEME:  There are no carotid bruits bilaterally.  Neurological examination:  Orientation: The patient is alert and oriented x3.  Cranial nerves: There is good facial symmetry. There is significant facial hypomimia.   The visual fields are full to confrontational testing. The speech is fluent and clear. There is hypophonic speech.  Soft palate rises symmetrically and there is no tongue deviation. Hearing is intact to conversational tone. Sensation: Sensation is intact to light touch throughout. Motor: Strength is 5/5 in the bilateral upper and lower extremities.   Shoulder shrug is equal and symmetric.  There is no pronator drift.   Movement examination: Tone: There is increased tone in the RUE, overall moderate but mod-severe with distraction procedures.  Tone in the LUE is mildly increased and mild-mod with distraction Abnormal movements: no significant tremor, even with distraction.   Coordination:  There is minor decremation with RAM's, seen mostly with finger taps on the right Gait and Station: The patient rises up without the use of his hands on the 2nd attempt.  He is just starting to shuffle.  He has RUE resting tremor that is mild.  He has decreased arm  swing on the right.       ASSESSMENT/PLAN:  1.  Idiopathic Parkinson's disease, diagnosed in December, 2015  -I talked to him extensively about [facility] as his voice has become hypophonic.  He is not interested in going right now.  -He is concerned about work.  He just had surgery and is not yet back to work. 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  I will support him if  he wants to leave work.     -I again stressed to him about the importance of safe, cardiovascular exercise.  -He asked me multiple questions again about disability.  I am happy to support him in whatever he chooses.  -asked multiple questions about future expectations and I am answered them to the best of my ability.  Much greater than 50% of this visit was spent in counseling with the patient and the family.  Total face to face time:  25 min 2.  I will see the patient back in 6 months, but I am happy to see him before that if new neurologic issues arise.

## 2015-07-06 ENCOUNTER — Telehealth: Payer: Self-pay | Admitting: Neurology

## 2015-07-06 NOTE — Telephone Encounter (Signed)
Pt would like the status of the work form that was faxed to [place] please call him at [phone]

## 2015-07-06 NOTE — Telephone Encounter (Signed)
Spoke with patient who advises he would like to be out of work permanently. Forms completed and faxed to [place] attn: Robbin at [phone] with confirmation received.

## 2015-08-02 ENCOUNTER — Telehealth: Payer: Self-pay | Admitting: Neurology

## 2015-08-02 NOTE — Telephone Encounter (Signed)
Pt/[phone]// called for info about his condition from this office/ for his short-term disability/ from [facility] [employer]/Claim # [id number]/

## 2015-08-02 NOTE — Telephone Encounter (Signed)
Request received from [facility] for last office note and faxed to them at [phone] with confirmation received per patient request/ signature for release of information received.

## 2015-08-02 NOTE — Telephone Encounter (Signed)
Spoke with patient and he states [employer] has sent [place] forms and not received them back. Made him aware I have received no forms. He took our fax number and will confirm with Janeece Riggers that they are being sent to the correct place. He will call with any problems.

## 2015-08-04 ENCOUNTER — Telehealth: Payer: Self-pay | Admitting: Neurology

## 2015-08-04 NOTE — Telephone Encounter (Signed)
Pt called to check if the short-term disability docs have come in?//call back @ [phone]

## 2015-08-04 NOTE — Telephone Encounter (Signed)
Patient made aware request was received and records sent on 08/02/2015.

## 2015-08-22 ENCOUNTER — Ambulatory Visit: Payer: Commercial Managed Care - HMO | Admitting: Neurology

## 2015-11-07 ENCOUNTER — Telehealth: Payer: Self-pay | Admitting: Neurology

## 2015-11-07 NOTE — Telephone Encounter (Signed)
Spoke with patient. He states social security disability denied his claim. He asked about next steps. I stated that he should get paperwork that tells him what steps he can take next as far as appealing decision, etc. He will contact us if he needs anything further.

## 2015-11-07 NOTE — Telephone Encounter (Signed)
Pt needs to talk to someone about disability please call [phone]

## 2015-11-20 ENCOUNTER — Encounter: Payer: Self-pay | Admitting: Neurology

## 2015-11-20 ENCOUNTER — Ambulatory Visit (INDEPENDENT_AMBULATORY_CARE_PROVIDER_SITE_OTHER): Payer: 59 | Admitting: Neurology

## 2015-11-20 VITALS — BP 120/70 | HR 75 | Ht 68.0 in | Wt 150.0 lb

## 2015-11-20 DIAGNOSIS — G20 Parkinson's disease: Secondary | ICD-10-CM

## 2015-11-20 MED ORDER — PRAMIPEXOLE DIHYDROCHLORIDE 0.125 MG PO TABS: ORAL_TABLET | ORAL | Status: DC

## 2015-11-20 MED ORDER — PRAMIPEXOLE DIHYDROCHLORIDE 0.5 MG PO TABS: 0.5000 mg | ORAL_TABLET | Freq: Three times a day (TID) | ORAL | Status: DC

## 2015-11-20 NOTE — Progress Notes (Signed)
Aaron Robertson was seen today in the movement disorders clinic for neurologic consultation at the request of [name], MD.  The consultation is for the evaluation of tremor, voice changes and gait change.   Sx's have been going on for 6 months.  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  He states that he would concentrate on it and it would go away.  It actually has been better over the last few weeks/months.  In fact, he states that most of the above sx's are better than they were a few months ago.  02/16/15 update:  The patient presents today for follow-up.  He was diagnosed with Parkinson's disease last visit, but refuses all medication for it, as he unfortunately believes that starting medication would speed up the degenerative process, even though no literature supports this.  He also states today that he just really doesn't feel he needs it.  He cannot tell that he is rigid and generally feels well most days.  Pt brings in an "extra" video clip that lasted a full 6 minutes that he asked me to look at with him regarding the "protandim" that he is now taking.  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  Occasionally rides on the bike.  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  Thinks that there may be a buyout this summer at work and thinks that he may take that if it is an option.  States that tremor is better.    06/29/15 update:  The patient returns today for follow-up.  He has a history of Parkinson's disease and has been fairly rigid, but refuses medications.  He denies hallucinations.  He denies falls.  He denies any near syncopal episodes since our last visit.  I did review records since last visit.  He underwent a right inguinal hernia repair on May 15, 2015.  States that he didn't go back to work.   He is noting more tremor especially when the hand is at rest and worries about that at work, as he is working with chemicals.  He is noting a loss of voice.    11/20/15 update:  The patient returns today for follow-up.  He is on no medication for Parkinson's disease, as he has refused medication thus far.  He refused LSVT loud at our last visit as well.  I have filled out disability papers on him as he felt that he was no longer able to work.  He is frustrated that his Social Security disability got denied; states that he was examined by a SS IM doctor and a ENT and it was thought that it would go through.  He was previously working with chemicals and felt it unsafe to do so.  Notes that he has R hand tremor when laying in bed.  Reading about supplements and marijuana plants.  When asked why he doesn't want to consider traditional meds, he says that he hasn't read about those (only reads about SE of them).  Asks me about those today.  Having more trouble with buttoning buttons, pulling pants up.    Neuroimaging has not previously been performed.   PREVIOUS MEDICATIONS: none to date  ALLERGIES:  No Known Allergies  CURRENT MEDICATIONS:  Outpatient Encounter Prescriptions  as of 11/20/2015  Medication Sig  . OVER THE COUNTER MEDICATION Take by mouth. Protandim - 1 daily; takes 2 tabs. in AM, 1 after lunch and 1 after dinner   No facility-administered encounter medications on file as of 11/20/2015.    PAST MEDICAL HISTORY:   Past Medical History  Diagnosis Date  . Parkinson's disease ([facility])   . Inguinal hernia 04/2015    right    PAST SURGICAL HISTORY:   Past Surgical History  Procedure Laterality Date  . Colonoscopy    . Inguinal hernia repair Right 05/15/2015    Procedure: OPEN RIGHT INGUINAL HERNIA REPAIR ;  Surgeon: Fanny Skates, MD;  Location: [place];  Service: General;  Laterality: Right;  . Insertion of mesh Right 05/15/2015    Procedure: INSERTION OF MESH;  Surgeon:  Fanny Skates, MD;  Location: [facility];  Service: General;  Laterality: Right;    SOCIAL HISTORY:   Social History   Social History  . Marital Status: Married    Spouse Name: N/A  . Number of Children: N/A  . Years of Education: N/A   Occupational History  . Not on file.   Social History Main Topics  . Smoking status: Never Smoker   . Smokeless tobacco: Never Used  . Alcohol Use: 0.0 oz/week    0 Standard drinks or equivalent per week     Comment: occasionally  . Drug Use: No  . Sexual Activity: Not on file   Other Topics Concern  . Not on file   Social History Narrative    FAMILY HISTORY:   Family Status  Relation Status Death Age  . Mother Alive     heart disease  . Maternal Uncle Deceased   . Father Deceased     alzheimer's   . Paternal Grandmother Deceased     ALS  . Sister Alive     hyperlipidemia  . Sister Alive     hyperlipidemia  . Son Alive     healthy  . Daughter Alive     healthy    ROS:  A complete 10 system review of systems was obtained and was unremarkable apart from what is mentioned above.  PHYSICAL EXAMINATION:    VITALS:   Filed Vitals:   11/20/15 0839  BP: 120/70  Pulse: 75  Height: 5\' 8"  (1.727 m)  Weight: 150 lb (68.04 kg)   Wt Readings from Last 3 Encounters:  11/20/15 150 lb (68.04 kg)  06/29/15 153 lb (69.4 kg)  05/15/15 148 lb (67.132 kg)     GEN:  The patient appears stated age and is in NAD. HEENT:  Normocephalic, atraumatic.  The mucous membranes are moist. The superficial temporal arteries are without ropiness or tenderness. CV:  RRR Lungs:  CTAB Neck/HEME:  There are no carotid bruits bilaterally.  Neurological examination:  Orientation: The patient is alert and oriented x3.  Cranial nerves: There is good facial symmetry. There is significant facial hypomimia.   The visual fields are full to confrontational testing. The speech is fluent and clear. There is hypophonic speech.  Soft palate  rises symmetrically and there is no tongue deviation. Hearing is intact to conversational tone. Sensation: Sensation is intact to light touch throughout. Motor: Strength is 5/5 in the bilateral upper and lower extremities.   Shoulder shrug is equal and symmetric.  There is no pronator drift.   Movement examination: Tone: There is increased tone in the RUE, overall moderate but mod-severe with distraction  procedures.  Tone in the LUE is mildly increased and mild-mod with distraction Abnormal movements: Rare tremor in the RUE but when it is evident it becomes moderate in nature. Coordination:  There is minor decremation with RAM's, seen mostly with finger taps on the right Gait and Station: The patient rises up without the use of his hands on the 1rst attempt.  He is just starting to shuffle.  He has RUE resting tremor that is mod with ambulation.  He has decreased arm swing on the right.       ASSESSMENT/PLAN:  1.  Idiopathic Parkinson's disease, diagnosed in December, 2015  -I talked to him extensively about LSVT LOUD as his voice has become hypophonic.  He is ready to pursue that now.  -He is willing to start pramipexole and will work up to 0.5 tid.  Risks, benefits, side effects and alternative therapies were discussed.  The opportunity to ask questions was given and they were answered to the best of my ability.  The patient expressed understanding and willingness to follow the outlined treatment protocols.  Talked to him about risk of sleep attacks, compulsive behaviors.    -I again stressed to him about the importance of safe, cardiovascular exercise.  -He is continuing to try and pursue his disability  -asked multiple questions about future expectations and I am answered them to the best of my ability.  Much greater than 50% of this visit was spent in counseling with the patient and the family.  Total face to face time:  25 min 2.  I will see the patient back in 6 months, but I am happy to  see him before that if new neurologic issues arise.

## 2015-11-20 NOTE — Patient Instructions (Signed)
1.  Start mirapex (pramipexole) as follows:  0.125 mg - 1 tablet three times per day for a week, then 2 tablets three times per day for a week and then fill the 0.5 mg tablet and take that, 1 pill three times per day 2.  We will have them call you about the speech therapy for parkinsons disease

## 2015-12-12 ENCOUNTER — Ambulatory Visit (HOSPITAL_COMMUNITY): Payer: 59 | Attending: Neurology | Admitting: Physical Therapy

## 2015-12-12 DIAGNOSIS — G20 Parkinson's disease: Secondary | ICD-10-CM | POA: Diagnosis not present

## 2015-12-12 DIAGNOSIS — G20A1 Parkinson's disease without dyskinesia, without mention of fluctuations: Secondary | ICD-10-CM

## 2015-12-12 DIAGNOSIS — R2681 Unsteadiness on feet: Secondary | ICD-10-CM | POA: Diagnosis present

## 2015-12-12 DIAGNOSIS — M6281 Muscle weakness (generalized): Secondary | ICD-10-CM | POA: Diagnosis present

## 2015-12-12 DIAGNOSIS — R269 Unspecified abnormalities of gait and mobility: Secondary | ICD-10-CM | POA: Diagnosis present

## 2015-12-12 NOTE — Therapy (Signed)
[place], [place], [address] Phone: [phone]   Fax:  [phone]  Physical Therapy Evaluation  Patient Details  Name: Aaron Robertson MRN: [id number] Date of Birth: 1956/02/17 Referring Provider: Wells Guiles [name]   Encounter Date: 12/12/2015      PT End of Session - 12/12/15 1736    Visit Number 1   Number of Visits 6   Date for PT Re-Evaluation 01/09/16   Authorization Type [employer] (40 PT/OT/speech)   Authorization Time Period 12/12/15 to 01/23/16   PT Start Time 1647   PT Stop Time 1732   PT Time Calculation (min) 45 min   Activity Tolerance Patient tolerated treatment well   Behavior During Therapy [facility] for tasks assessed/performed      Past Medical History  Diagnosis Date  . Parkinson's disease ([place])   . Inguinal hernia 04/2015    right    Past Surgical History  Procedure Laterality Date  . Colonoscopy    . Inguinal hernia repair Right 05/15/2015    Procedure: OPEN RIGHT INGUINAL HERNIA REPAIR ;  Surgeon: Fanny Skates, MD;  Location: [place];  Service: General;  Laterality: Right;  . Insertion of mesh Right 05/15/2015    Procedure: INSERTION OF MESH;  Surgeon: Fanny Skates, MD;  Location: [place];  Service: General;  Laterality: Right;    There were no vitals filed for this visit.  Visit Diagnosis:  Parkinson's disease ([place]) - Plan: PT plan of care cert/re-cert  Muscle weakness - Plan: PT plan of care cert/re-cert  Unsteadiness - Plan: PT plan of care cert/re-cert  Abnormality of gait - Plan: PT plan of care cert/re-cert      Subjective Assessment - 12/12/15 1654    Subjective Patient reports that he has started a new medicine wit his MD, about 3 weeks ago; he reports that he has had one fall while steppig out of horse trailer but this is not normal, this one was just because saddle got caught. He is normally very active, rides and takes care of horses and  states that he did 50 pushups this morning.    Pertinent History BPH, hx of hernia surgery with mesh placed    Patient Stated Goals just getting checked out right now due to MD recommendation    Currently in Pain? No/denies            [facility] PT Assessment - 12/12/15 0001    Assessment   Medical Diagnosis parkinsons disease    Referring Provider Wells Guiles [name]    Onset Date/Surgical Date --  about a year ago this past december    Next MD Visit Dr. Carles Collet May 9th    Precautions   Precautions None   Restrictions   Weight Bearing Restrictions No   Balance Screen   Has the patient fallen in the past 6 months Yes   How many times? 1- fell due to horse saddle getting caught on trailer    Has the patient had a decrease in activity level because of a fear of falling?  Yes   Is the patient reluctant to leave their home because of a fear of falling?  Yes   Prior Function   Level of Independence Independent;Independent with basic ADLs;Independent with gait;Independent with transfers   Vocation Other (comment)  work does not want him to come back due to safety concerns    Leisure horseback riding, exercise    Strength   Right  Hip Flexion 5/5   Right Hip Extension 2+/5   Right Hip ABduction 4/5   Left Hip Flexion 5/5   Left Hip Extension 2/5   Left Hip ABduction 4-/5   Right Knee Flexion 4/5   Right Knee Extension 4/5   Left Knee Flexion 4/5   Left Knee Extension 4-/5   Right Ankle Dorsiflexion 4+/5   Left Ankle Dorsiflexion 4/5   Ambulation/Gait   Gait Comments flexed at hips, reduced gait speed, reduced pelvic/hip roration, scuffing of feet during gait   6 minute walk test results    Aerobic Endurance Distance Walked 1017   Endurance additional comments 6MWT, no rest breaks    Furniture conservator/restorer   Sit to Stand Able to stand without using hands and stabilize independently   Standing Unsupported Able to stand safely 2 minutes   Sitting with Back Unsupported but Feet Supported on Floor  or Stool Able to sit safely and securely 2 minutes   Stand to Sit Sits safely with minimal use of hands   Transfers Able to transfer safely, minor use of hands   Standing Unsupported with Eyes Closed Able to stand 10 seconds safely   Standing Ubsupported with Feet Together Able to place feet together independently and stand 1 minute safely   From Standing, Reach Forward with Outstretched Arm Can reach confidently >25 cm (10")   From Standing Position, Pick up Object from Floor Able to pick up shoe safely and easily   From Standing Position, Turn to Look Behind Over each Shoulder Turn sideways only but maintains balance   Turn 360 Degrees Able to turn 360 degrees safely in 4 seconds or less   Standing Unsupported, Alternately Place Feet on Step/Stool Able to stand independently and safely and complete 8 steps in 20 seconds   Standing Unsupported, One Foot in Front Able to place foot tandem independently and hold 30 seconds   Standing on One Leg Able to lift leg independently and hold 5-10 seconds   Total Score 53                           PT Education - 12/12/15 1735    Education provided Yes   Education Details prognosis, plan of care, importance of regular activity with PD and outcomes of PD medication in combination with activity, enoucraged to remain as active as possible    Person(s) Educated Patient   Methods Explanation   Comprehension Verbalized understanding          PT Short Term Goals - 12/12/15 1744    PT SHORT TERM GOAL #1   Title Patient to demonstrate improved gait mechanics over even and uneven surfaces, including improved foot clearance, elimination of scuffing gait pattern, improved gait speed, in order to improve general mobilty and reduce fall risk    Time 3   Period Weeks   Status New   PT SHORT TERM GOAL #2   Title Patient to be able to verbalize the improtance of correct posture and will be independent in performing appropriate postural  stretches on regular basis in order to reduce parkinsonian posture and improve respiration and function    Time 3   Period Weeks   Status New   PT SHORT TERM GOAL #3   Title Patient to be independent in correctly and consistently performing appropriate Parkinson's based HEP,  to be updated PRN    Time 3   Period Weeks   Status  New           PT Long Term Goals - 12/12/15 1746    PT LONG TERM GOAL #1   Title Patient to demonstrate strength at least 4+/5 in all tested muscle groups in order to improve dynamic stability and general mobility   Time 6   Period Weeks   Status New   PT LONG TERM GOAL #2   Title Patient to be able to ambulate over surfaces such as grass, dirt trails, loose gravel with minimal unsteadiness and minimal fall risk in order to improve his function and safety around his property    Time 6   Period Weeks   Status New   PT LONG TERM GOAL #3   Title Pateint to be able to ambulate at least 1342ft during 6MWT with no rest breaks in order to demonstrate enhanced mobility and readiness to return to commnity based tasks with minimal fall risk    Time 6   Period Weeks   Status New   PT LONG TERM GOAL #4   Title Patient to be able to verbalize importance of regular physical exercise in relation to Parkinsons disesae and will report that he is performing at least 30-45 minutes of aerobic activity 5 days per week in order to assist in managing parkinsonian symptoms and preserve function    Time 6   Period Weeks   Status New               Plan - 12/12/15 1738    Clinical Impression Statement Patient arrives with long standing diagnosis of Parkinson's Disease; he reports he is coming PT solely because his MD recommended it and has more concerns regarding his voice and speech volume than his physical performance. He states that prior to diagnoiss of PD he was very active and has been trying his best to stay active even with the parkinson's, did 50 pushups this  morning and is very active working with and riding his horses. He does have concerns about his relationship between himself and his wife, as well as his morale overall, specifically due to PD and was encouraged to speak to MD about this in order to be referred for appropriate resources in community. Upon examination, did note postural and gait impairment, muscle weakness, and mild unsteadiness, all of which can be addressed with skilled PT services; educated patient that while he is very high level for someone with parkinson's he will still benefit from coming to skilled services especially in combination with appropriate medication. Patient requests 1x/week due to concern over co-pay. At this point recommend skilled PT services in order to address functional limitations, to reduce fall risk, and to assist in reaching optimal level of function.    Pt will benefit from skilled therapeutic intervention in order to improve on the following deficits Abnormal gait;Decreased activity tolerance;Decreased strength;Decreased balance;Decreased coordination;Impaired flexibility;Postural dysfunction   Rehab Potential Good   PT Frequency 1x / week   PT Duration 6 weeks   PT Treatment/Interventions ADLs/Self Care Home Management;Biofeedback;Electrical Stimulation;Moist Heat;Gait training;Stair training;Functional mobility training;Therapeutic activities;Therapeutic exercise;Balance training;Neuromuscular re-education;Patient/family education;Manual techniques;Energy conservation;Taping   PT Next Visit Plan review initial eval and goals; assign appropriate HEP; functional postural training, gait and balance, functional strength, boxing and swinging for mobility    PT Home Exercise Plan please assign next session    Recommended Other Services speech therapy eval    Consulted and Agree with Plan of Care Patient  Problem List Patient Active Problem List   Diagnosis Date Noted  . Right inguinal hernia  05/15/2015  . Hyperlipidemia 09/29/2014  . Parkinson's disease ([place]) 09/27/2014  . Other fatigue 08/22/2014  . Tremors of nervous system 08/22/2014  . Abnormality of gait 08/22/2014  . BPH (benign prostatic hyperplasia) 08/22/2014  . Movement disorder 08/22/2014   Deniece Ree PT, DPT [phone]  [facility] [address] [place], [place], [address] Phone: [phone]   Fax:  [phone]  Name: Aaron Robertson MRN: [id number] Date of Birth: [birth year]

## 2015-12-20 ENCOUNTER — Telehealth (HOSPITAL_COMMUNITY): Payer: Self-pay | Admitting: Physical Therapy

## 2015-12-20 NOTE — Telephone Encounter (Signed)
Called and spoke to patient regarding his request to cancel all skilled PT appointments; explained that PT phrase "high level" does indicate relatively good function however does not indicate that patient is not in need of/would not benefit from skilled PT services. Reviewed impairments found at initial eval and explained that Parkinsons is typically most well controlled and managed with combination of medications from MD and skilled intervention from Physical Therapist. Patient continued to deny need for skilled PT services, stating "thank you for following up with me, I appreciate it but I am really most concerned about my voice right now; I'll think about PT and let you know but for now go ahead and cancel my PT appointments. Passed this information on along to front desk staff.  Deniece Ree PT, DPT [phone]

## 2015-12-20 NOTE — Telephone Encounter (Signed)
Pt called to request that all future appts for PT be cx, [facility] was told and called pt to encourage him to continue. Pt states he will think about it and let us know when he arrives for his Speech Eval. NF 12/29/15

## 2015-12-21 ENCOUNTER — Ambulatory Visit (HOSPITAL_COMMUNITY): Payer: 59 | Admitting: Physical Therapy

## 2015-12-26 ENCOUNTER — Ambulatory Visit (HOSPITAL_COMMUNITY): Payer: 59 | Admitting: Physical Therapy

## 2015-12-28 ENCOUNTER — Ambulatory Visit: Payer: 59 | Admitting: Neurology

## 2016-01-02 ENCOUNTER — Ambulatory Visit (HOSPITAL_COMMUNITY): Payer: 59 | Admitting: Physical Therapy

## 2016-01-09 ENCOUNTER — Encounter (HOSPITAL_COMMUNITY): Payer: Self-pay | Admitting: Speech Pathology

## 2016-01-09 ENCOUNTER — Ambulatory Visit (HOSPITAL_COMMUNITY): Payer: 59 | Admitting: Physical Therapy

## 2016-01-09 ENCOUNTER — Ambulatory Visit (HOSPITAL_COMMUNITY): Payer: 59 | Attending: Neurology | Admitting: Speech Pathology

## 2016-01-09 DIAGNOSIS — R471 Dysarthria and anarthria: Secondary | ICD-10-CM | POA: Insufficient documentation

## 2016-01-09 NOTE — Therapy (Addendum)
[place], [place], [address] Phone: [phone]   Fax:  [phone]  Speech Language Pathology Evaluation  Patient Details  Name: Aaron Robertson MRN: [id number] Date of Birth: [birth year] Referring Provider: Wells Guiles [name]  Encounter Date: 01/09/2016      End of Session - 01/09/16 1735    Visit Number 1   Number of Visits 1   Date for SLP Re-Evaluation 02/09/16   Authorization Type [employer]   Authorization Time Period 01/09/2016-02/09/2016   SLP Start Time 1   SLP Stop Time  1605   SLP Time Calculation (min) 50 min   Activity Tolerance Patient tolerated treatment well      Past Medical History  Diagnosis Date  . Parkinson's disease ([place])   . Inguinal hernia 04/2015    right    Past Surgical History  Procedure Laterality Date  . Colonoscopy    . Inguinal hernia repair Right 05/15/2015    Procedure: OPEN RIGHT INGUINAL HERNIA REPAIR ;  Surgeon: Fanny Skates, MD;  Location: [facility];  Service: General;  Laterality: Right;  . Insertion of mesh Right 05/15/2015    Procedure: INSERTION OF MESH;  Surgeon: Fanny Skates, MD;  Location: [facility];  Service: General;  Laterality: Right;    There were no vitals filed for this visit.  Visit Diagnosis: Dysarthria and anarthria - Plan: SLP plan of care cert/re-cert      Subjective Assessment - 01/09/16 1644    Subjective "My voice is flat."   Special Tests informal cog-ling measures   Currently in Pain? No/denies            SLP Evaluation [facility] - 01/09/16 1644    SLP Visit Information   SLP Received On 01/09/16   Referring Provider Wells Guiles [name]   Onset Date 09/2014   Medical Diagnosis Parkinson's disease   Subjective   Subjective "My voice is very flat."   Patient/Family Stated Goal "Improve my voice."   General Information   HPI Mr. Jaire "Aaron Robertson" Erhart was referred by Dr. Wells Guiles [name] (Neurology) for speech/language  evaluation due to diagnosis of Parkinson's Disease with dysarthria. [name] was diagnosed with Parkinson's in December 2015 (symptoms noted longer than that per patient). He declined medications for Parkinson's until his last visit to Dr. Carles Collet in February 2017 when he agreed to start pramipexole. He was most bothered by hypophonia, tremor in right hand, and dragging his feet. Previously, he declined evaluation/treatment with PT/SLP but is now willing. He had a PT evaluation here at the end of February and skilled PT was recommended, however pt declined due to concerns about co-pays and his feeling that he "didn't need it". Pt has been taking Mirapex now for about a month and reports a positive change from taking it, but says it did not help his "voice or tremors". He is unable to work and is in the process of applying for disability (denied and now repealing). [name] would like to have a stronger voice.   Behavioral/Cognition Alert, cooperative, talkative   Mobility Status ambulatory   Prior Functional Status   Cognitive/Linguistic Baseline Within functional limits   Type of Home House    Lives With Spouse   Available Support Family   Education some college before leaving to work for [employer] On disability   Cognition   Overall Cognitive Status Within Functional Limits for tasks assessed   Memory Appears intact  Awareness Appears intact   Problem Solving Appears intact   Auditory Comprehension   Overall Auditory Comprehension Appears within functional limits for tasks assessed   Yes/No Questions Within Functional Limits   Commands Within Functional Limits   Conversation Complex   Visual Recognition/Discrimination   Discrimination Within Function Limits   Reading Comprehension   Reading Status Within funtional limits   Expression   Primary Mode of Expression Verbal   Verbal Expression   Overall Verbal Expression Appears within functional limits for tasks assessed    Initiation No impairment   Level of Generative/Spontaneous Verbalization Conversation   Repetition No impairment   Naming No impairment   Pragmatics No impairment   Interfering Components Speech intelligibility   Non-Verbal Means of Communication Not applicable   Written Expression   Dominant Hand Right   Written Expression Not tested   Oral Motor/Sensory Function   Overall Oral Motor/Sensory Function Appears within functional limits for tasks assessed   Labial ROM Within Functional Limits   Labial Symmetry Within Functional Limits   Labial Strength Within Functional Limits   Labial Sensation Within Functional Limits   Labial Coordination WFL   Lingual ROM Within Functional Limits   Lingual Symmetry Within Functional Limits   Lingual Strength Within Functional Limits   Lingual Sensation Within Functional Limits   Lingual Coordination WFL   Facial ROM Within Functional Limits   Facial Symmetry Within Functional Limits   Facial Strength Within Functional Limits   Facial Sensation Within Functional Limits   Facial Coordination WFL   Velum Within Functional Limits   Mandible Within Functional Limits   Motor Speech   Overall Motor Speech Impaired   Respiration Impaired   Level of Impairment Sentence   Phonation Breathy;Low vocal intensity   Resonance Within functional limits   Articulation Within functional limitis   Intelligibility Intelligibility reduced   Word 75-100% accurate   Phrase 75-100% accurate   Sentence 75-100% accurate   Conversation 75-100% accurate   Motor Planning Witnin functional limits   Motor Speech Errors Not applicable   Effective Techniques Increased vocal intensity   Phonation Impaired   Volume Soft   Pitch Appropriate   Standardized Assessments   Standardized Assessments  Other Assessment  informal cog/ling                         SLP Education - 01/09/16 1733    Education provided Yes   Education Details Information  regarding results and recommendations from evaluation, prognosis for improvement given therapy, "use it or lose it", LSVT LOUD   Person(s) Educated Patient   Methods Explanation   Comprehension Verbalized understanding          SLP Short Term Goals - 01/09/16 1747    SLP SHORT TERM GOAL #1   Title Pt will sustain /a/ for greater than 15 seconds with use of multimodal cues with appropriate loudness.   Baseline ~9 seconds   Time 4   Period Weeks   Status New   SLP SHORT TERM GOAL #2   Title Pt will demonstrate increased vocal intensity by greater than 25% when orally reading functional phrases as judged by SLP and audio feedback.   Baseline mild/mod decreased loudness for quiet environment   Time 4   Period Weeks   Status New   SLP SHORT TERM GOAL #3   Title Pt will require min cues to increase vocal intensity during 1:1 conversations to appropriate level as judged by  SLP and audiofeedback.   Baseline mod cues   Time 4   Period Weeks   Status New   SLP SHORT TERM GOAL #4   Title Pt will increase self rating of voice quality and loudness from moderate impairment to min impairment through use of audio feedback and SLP cues.    Baseline moderate impairment per pt   Time 4   Period Weeks   Status New          SLP Long Term Goals - 01/09/16 1754    SLP LONG TERM GOAL #1   Title Same as short term goals          Plan - 01/09/16 1736    Clinical Impression Statement Mr. Hardenbrook presents with mild dysarthria characterized by mild/mod hypophonia, decreased intonation with flat vocal affect, and unequal stress with repetitive phoneme repetition tasks resulting in mildly reduced speech intellibility as judged by SLP in quiet, 1:1 environment. Pt also demonstrated difficulty adjusting vocal intensity when SLP continued conversation with him in noisy waiting room (still spoke with reduced intensity). Oral motor evaluation was WNL, however pt does report occasional difficulty  swallowing large pills and notes that he has always "choked easily on water". Mr. Randle reports being pleased with the positive impact the Mirapex has had on some of his symptoms, but feels it has not helped his voice. Skilled SLP intervention is recommended to address dysarthria. Pt worries about financial burden of co-pays and needed some encouragement to participate for a few weeks with goal of discharging pt to independence with HEP with modifcation of LSVT LOUD. Pt in agreement to come 1x/week for 4 weeks, however he understands that intensity and continuation is a key component to success with therapy.    Speech Therapy Frequency 1x /week  Pt requests only 1x/week due to financial burden   Duration 4 weeks   Treatment/Interventions Compensatory techniques;Cueing hierarchy;Internal/external aids;Patient/family education;Compensatory strategies;SLP instruction and feedback;Functional tasks   Potential to Achieve Goals Good   Potential Considerations Financial resources   SLP Home Exercise Plan Pt will complete HEP as assigned to facilitate carryover of treatment strategies and techniques in home environment   Consulted and Agree with Plan of Care Patient        Problem List Patient Active Problem List   Diagnosis Date Noted  . Right inguinal hernia 05/15/2015  . Hyperlipidemia 09/29/2014  . Parkinson's disease ([place]) 09/27/2014  . Other fatigue 08/22/2014  . Tremors of nervous system 08/22/2014  . Abnormality of gait 08/22/2014  . BPH (benign prostatic hyperplasia) 08/22/2014  . Movement disorder 08/22/2014    SPEECH THERAPY DISCHARGE SUMMARY  Visits from Start of Care: 1  Current functional level related to goals / functional outcomes: Same as evaluation. Pt did not wish to attend speech therapy due to financial concerns   Remaining deficits: As above   Education / Equipment: N/A  Plan: Patient agrees to discharge.  Patient goals were not met. Patient is being discharged  due to financial reasons.  ?????       Thank you,  Genene Churn, [place]  [facility] 01/09/2016, 5:58 PM  [facility] [address] [place], [place], [address] Phone: [phone]   Fax:  [phone]  Name: RAOUL CIANO MRN: [id number] Date of Birth: [birth year]

## 2016-01-12 ENCOUNTER — Telehealth (HOSPITAL_COMMUNITY): Payer: Self-pay

## 2016-01-12 NOTE — Telephone Encounter (Signed)
DATE: 01/12/16  Patient called to cancel his remaining appointments with Speech therapy. Pt states that his insurance has a high deductible and right now he is unable to afford to afford to pay that large amount at this time. Wishes to be discharged.

## 2016-01-16 ENCOUNTER — Ambulatory Visit (HOSPITAL_COMMUNITY): Payer: 59 | Admitting: Physical Therapy

## 2016-01-17 ENCOUNTER — Encounter (HOSPITAL_COMMUNITY): Payer: 59 | Admitting: Speech Pathology

## 2016-01-22 ENCOUNTER — Telehealth: Payer: Self-pay | Admitting: Neurology

## 2016-01-22 NOTE — Telephone Encounter (Signed)
Pt needs to talk to someone about his paperwork and please with the medication please call [phone]

## 2016-01-22 NOTE — Telephone Encounter (Signed)
Patient called to let us know they should be sending in request for records from his disability company. Aware I will look out for it and respond when I receive it.   He also wanted to make [place] aware the Pramipexole 0.5 mg tablets is working great for him. He feels like a new person. He is able to do things he hasn't done in a long while. He still has some tremor and trouble with speech (couldn't complete speech therapy due to cost) but otherwise he is doing great. He will keep follow up appt and call if needed.

## 2016-01-23 ENCOUNTER — Ambulatory Visit (HOSPITAL_COMMUNITY): Payer: 59 | Admitting: Physical Therapy

## 2016-01-24 ENCOUNTER — Encounter (HOSPITAL_COMMUNITY): Payer: 59 | Admitting: Speech Pathology

## 2016-01-24 ENCOUNTER — Ambulatory Visit (HOSPITAL_COMMUNITY): Payer: 59 | Admitting: Speech Pathology

## 2016-01-30 ENCOUNTER — Ambulatory Visit (HOSPITAL_COMMUNITY): Payer: 59 | Admitting: Physical Therapy

## 2016-02-06 ENCOUNTER — Ambulatory Visit (HOSPITAL_COMMUNITY): Payer: 59 | Admitting: Physical Therapy

## 2016-02-07 ENCOUNTER — Other Ambulatory Visit: Payer: Self-pay | Admitting: Neurology

## 2016-02-07 MED ORDER — PRAMIPEXOLE DIHYDROCHLORIDE 0.5 MG PO TABS: 0.5000 mg | ORAL_TABLET | Freq: Three times a day (TID) | ORAL | Status: DC

## 2016-02-07 NOTE — Telephone Encounter (Signed)
Mirapex refill requested. Per last office note- patient to remain on medication. Refill approved and sent to patient's pharmacy.   

## 2016-02-13 ENCOUNTER — Ambulatory Visit (HOSPITAL_COMMUNITY): Payer: 59 | Admitting: Physical Therapy

## 2016-02-20 ENCOUNTER — Encounter: Payer: Self-pay | Admitting: Neurology

## 2016-02-20 ENCOUNTER — Ambulatory Visit (INDEPENDENT_AMBULATORY_CARE_PROVIDER_SITE_OTHER): Payer: 59 | Admitting: Neurology

## 2016-02-20 ENCOUNTER — Ambulatory Visit: Payer: 59 | Admitting: Neurology

## 2016-02-20 VITALS — BP 118/62 | HR 68 | Ht 68.0 in | Wt 143.0 lb

## 2016-02-20 DIAGNOSIS — F330 Major depressive disorder, recurrent, mild: Secondary | ICD-10-CM | POA: Diagnosis not present

## 2016-02-20 DIAGNOSIS — G20 Parkinson's disease: Secondary | ICD-10-CM | POA: Diagnosis not present

## 2016-02-20 MED ORDER — PRAMIPEXOLE DIHYDROCHLORIDE 0.5 MG PO TABS: 0.5000 mg | ORAL_TABLET | Freq: Three times a day (TID) | ORAL | Status: DC

## 2016-02-20 NOTE — Progress Notes (Signed)
Aaron Robertson was seen today in the movement disorders clinic for neurologic consultation at the request of [name], MD.  The consultation is for the evaluation of tremor, voice changes and gait change.   Sx's have been going on for 6 months.  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  He states that he would concentrate on it and it would go away.  It actually has been better over the last few weeks/months.  In fact, he states that most of the above sx's are better than they were a few months ago.  02/16/15 update:  The patient presents today for follow-up.  He was diagnosed with Parkinson's disease last visit, but refuses all medication for it, as he unfortunately believes that starting medication would speed up the degenerative process, even though no literature supports this.  He also states today that he just really doesn't feel he needs it.  He cannot tell that he is rigid and generally feels well most days.  Pt brings in an "extra" video clip that lasted a full 6 minutes that he asked me to look at with him regarding the "protandim" that he is now taking.  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  Occasionally rides on the bike.  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  Thinks that there may be a buyout this summer at work and thinks that he may take that if it is an option.  States that tremor is better.    06/29/15 update:  The patient returns today for follow-up.  He has a history of Parkinson's disease and has been fairly rigid, but refuses medications.  He denies hallucinations.  He denies falls.  He denies any near syncopal episodes since our last visit.  I did review records since last visit.  He underwent a right inguinal hernia repair on May 15, 2015.  States that he didn't go back to work.   He is noting more tremor especially when the hand is at rest and worries about that at work, as he is working with chemicals.  He is noting a loss of voice.    11/20/15 update:  The patient returns today for follow-up.  He is on no medication for Parkinson's disease, as he has refused medication thus far.  He refused LSVT loud at our last visit as well.  I have filled out disability papers on him as he felt that he was no longer able to work.  He is frustrated that his Social Security disability got denied; states that he was examined by a SS IM doctor and a ENT and it was thought that it would go through.  He was previously working with chemicals and felt it unsafe to do so.  Notes that he has R hand tremor when laying in bed.  Reading about supplements and marijuana plants.  When asked why he doesn't want to consider traditional meds, he says that he hasn't read about those (only reads about SE of them).  Asks me about those today.  Having more trouble with buttoning buttons, pulling pants up.    02/20/16 update:  Pt returns for f/u re: PD.  The patient was started on pramipexole last visit and work to 0.5 mg 3 times a  day.  The patient reports good benefit with the medication.  He states that "it is a miracle drug."    He denies compulsive behaviors.  Denies sleep attacks.  Will have mild tremor if nervous or walking.  He can button clothing now and "pull my pants up like I am normal."  Admits that his wife moved out and they are in counseling so that has been a bit stressful.  States that he is losing weight because of that but states that he is eating.  States that he got down to 135 but has gained some weight back.   He was referred for physical therapy since last visit but refused the service.  He wanted speech therapy, but then refused the surface because of financial constraints.  He denies any falls but sometimes drags the feet.  Denies lightheadedness or near syncope.  Denies hallucinations.  Is doing  push ups but hasnt started biking yet.    Neuroimaging has not previously been performed.   PREVIOUS MEDICATIONS: none to date  ALLERGIES:  No Known Allergies  CURRENT MEDICATIONS:  Outpatient Encounter Prescriptions as of 02/20/2016  Medication Sig  . pramipexole (MIRAPEX) 0.5 MG tablet Take 1 tablet (0.5 mg total) by mouth 3 (three) times daily.  . [DISCONTINUED] Coconut Oil 1000 MG CAPS Take by mouth daily.  . [DISCONTINUED] OVER THE COUNTER MEDICATION Take by mouth. Protandim - 1 daily; takes 2 tabs. in AM, 1 after lunch and 1 after dinner   No facility-administered encounter medications on file as of 02/20/2016.    PAST MEDICAL HISTORY:   Past Medical History  Diagnosis Date  . Parkinson's disease ([place])   . Inguinal hernia 04/2015    right    PAST SURGICAL HISTORY:   Past Surgical History  Procedure Laterality Date  . Colonoscopy    . Inguinal hernia repair Right 05/15/2015    Procedure: OPEN RIGHT INGUINAL HERNIA REPAIR ;  Surgeon: Fanny Skates, MD;  Location: [facility];  Service: General;  Laterality: Right;  . Insertion of mesh Right 05/15/2015    Procedure: INSERTION OF MESH;  Surgeon: Fanny Skates, MD;  Location: [facility];  Service: General;  Laterality: Right;    SOCIAL HISTORY:   Social History   Social History  . Marital Status: Married    Spouse Name: N/A  . Number of Children: N/A  . Years of Education: N/A   Occupational History  . Not on file.   Social History Main Topics  . Smoking status: Never Smoker   . Smokeless tobacco: Never Used  . Alcohol Use: 0.0 oz/week    0 Standard drinks or equivalent per week     Comment: occasionally  . Drug Use: No  . Sexual Activity: Not on file   Other Topics Concern  . Not on file   Social History Narrative    FAMILY HISTORY:   Family Status  Relation Status Death Age  . Mother Alive     heart disease  . Maternal Uncle Deceased   . Father Deceased     alzheimer's     . Paternal Grandmother Deceased     ALS  . Sister Alive     hyperlipidemia  . Sister Alive     hyperlipidemia  . Son Alive     healthy  . Daughter Alive     healthy    ROS:  A complete 10 system review of systems was obtained and was unremarkable apart from what  is mentioned above.  PHYSICAL EXAMINATION:    VITALS:   Filed Vitals:   02/20/16 0802  BP: 118/62  Pulse: 68  Height: 5\' 8"  (1.727 m)  Weight: 143 lb (64.864 kg)   Wt Readings from Last 3 Encounters:  02/20/16 143 lb (64.864 kg)  11/20/15 150 lb (68.04 kg)  06/29/15 153 lb (69.4 kg)     GEN:  The patient appears stated age and is in NAD. HEENT:  Normocephalic, atraumatic.  The mucous membranes are moist. The superficial temporal arteries are without ropiness or tenderness. CV:  RRR Lungs:  CTAB Neck/HEME:  There are no carotid bruits bilaterally.  Neurological examination:  Orientation: The patient is alert and oriented x3.  Cranial nerves: There is good facial symmetry. There is significant facial hypomimia.   The visual fields are full to confrontational testing. The speech is fluent and clear. There is hypophonic speech.  Soft palate rises symmetrically and there is no tongue deviation. Hearing is intact to conversational tone. Sensation: Sensation is intact to light touch throughout. Motor: Strength is 5/5 in the bilateral upper and lower extremities.   Shoulder shrug is equal and symmetric.  There is no pronator drift.   Movement examination: Tone: There is normal tone in the UE today Abnormal movements: RUE noted with ambulation only Coordination:  There is no decremation with RAM's, with any form of RAMS, including alternating supination and pronation of the forearm, hand opening and closing, finger taps, heel taps and toe taps. Gait and Station: The patient rises up without the use of his hands on the 1rst attempt.  He is not shuffling.  Increase in the RUE tremor with ambulation.  Negative pull  test.    LABS:    Chemistry      Component Value Date/Time   NA 137 05/10/2015 0935   NA 140 08/24/2014 0823   K 4.5 05/10/2015 0935   CL 102 05/10/2015 0935   CO2 30 05/10/2015 0935   BUN 14 05/10/2015 0935   BUN 17 08/24/2014 0823   CREATININE 1.03 05/10/2015 0935      Component Value Date/Time   CALCIUM 9.1 05/10/2015 0935   ALKPHOS 48 05/10/2015 0935   AST 20 05/10/2015 0935   ALT 10* 05/10/2015 0935   BILITOT 1.0 05/10/2015 0935       ASSESSMENT/PLAN:  1.  Idiopathic Parkinson's disease, diagnosed in December, 2015  -I talked to him extensively about LSVT LOUD as his voice has become hypophonic.  He was referred but then felt he couldn't afford so cx  -Continue pramipexole 0.5 mg tid.  Risks, benefits, side effects and alternative therapies were discussed including but not limited to sleep attacks and compulsive behaviors.  The opportunity to ask questions was given and they were answered to the best of my ability.  The patient expressed understanding and willingness to follow the outlined treatment protocols.  -I again stressed to him about the importance of safe, cardiovascular exercise.  Wants to road bike and encouraged him to stationary bike instead.  Talked to him about [employer] programs.    -told him to make a PE appt as i like to see labs about yearly.  Last labs July, 2016. 2.  Depression  -likely situational as wife just left the home.  Going to start marital counseling and encouraged him to do so. 3.  I will see the patient back in 4 months, but I am happy to see him before that if new neurologic issues arise.

## 2016-04-02 ENCOUNTER — Ambulatory Visit (INDEPENDENT_AMBULATORY_CARE_PROVIDER_SITE_OTHER): Payer: 59 | Admitting: Family Medicine

## 2016-04-02 ENCOUNTER — Ambulatory Visit (INDEPENDENT_AMBULATORY_CARE_PROVIDER_SITE_OTHER): Payer: 59

## 2016-04-02 ENCOUNTER — Encounter: Payer: Self-pay | Admitting: Family Medicine

## 2016-04-02 VITALS — BP 105/73 | HR 70 | Temp 97.0°F | Ht 68.0 in | Wt 142.8 lb

## 2016-04-02 DIAGNOSIS — Z0000 Encounter for general adult medical examination without abnormal findings: Secondary | ICD-10-CM

## 2016-04-02 DIAGNOSIS — R5383 Other fatigue: Secondary | ICD-10-CM

## 2016-04-02 DIAGNOSIS — E785 Hyperlipidemia, unspecified: Secondary | ICD-10-CM

## 2016-04-02 DIAGNOSIS — E559 Vitamin D deficiency, unspecified: Secondary | ICD-10-CM

## 2016-04-02 DIAGNOSIS — N400 Enlarged prostate without lower urinary tract symptoms: Secondary | ICD-10-CM

## 2016-04-02 DIAGNOSIS — Z1211 Encounter for screening for malignant neoplasm of colon: Secondary | ICD-10-CM

## 2016-04-02 NOTE — Addendum Note (Signed)
Addended by: Thana Ates on: 04/02/2016 11:04 AM   Modules accepted: Orders, SmartSet

## 2016-04-02 NOTE — Addendum Note (Signed)
Addended by: Thana Ates on: 04/02/2016 11:18 AM   Modules accepted: Orders, SmartSet

## 2016-04-02 NOTE — Patient Instructions (Signed)
Continue to follow-up with neurology We will arrange for a visit with the gastroenterologist to see if you need another colonoscopy. Please keep this appointment Exercise regularly Continue current medications We will call with lab work results as soon as everything is returned

## 2016-04-02 NOTE — Progress Notes (Signed)
Subjective:    Patient ID: Aaron Robertson, male    DOB: [birth year], 60 y.o.   MRN: [id number]  HPI Patient is here today for a complete physical. He has no other complaints today. The patient has a history of Parkinson's disease. He is seeing a neurologist regularly. He is taking Mirapex for this. He will get lab work done today and is due to a rectal exam for his prostate. He will also get a fecal occult blood test. We will also get a chest x-ray. He is 59 and had his last colonoscopy when he was 30. His father had a history of colon polyps. The patient denies chest pain shortness of breath trouble swallowing heartburn indigestion nausea vomiting diarrhea or blood in the stool. He does have constipation and takes magnesium citrate for this on a regular basis. He's passing his water without problems. He is very positive and upbeat and says his only signs of Parkinson's disease are that he has a tremor in his right hand at times. Otherwise he is feeling great. He is exercising regularly and does pushups regularly. He says he feels great and we're excited for him for this. He is upset because his wife recently left him. This was about 7-8 months ago. He seems to be okay with this today. The patient indicates that his father died of Alzheimer's and that his paternal grandmother had ALS. His mother recently had back surgery. She is still living.   Review of Systems  Constitutional: Negative.   HENT: Negative.   Eyes: Negative.   Respiratory: Negative.   Cardiovascular: Negative.   Gastrointestinal: Negative.   Endocrine: Negative.   Genitourinary: Negative.   Musculoskeletal: Negative.   Skin: Negative.   Allergic/Immunologic: Negative.   Neurological: Negative.   Hematological: Negative.   Psychiatric/Behavioral: Negative.          Patient Active Problem List   Diagnosis Date Noted  . Right inguinal hernia 05/15/2015  . Hyperlipidemia 09/29/2014  . Parkinson's disease ([place])  09/27/2014  . Other fatigue 08/22/2014  . Tremors of nervous system 08/22/2014  . Abnormality of gait 08/22/2014  . BPH (benign prostatic hyperplasia) 08/22/2014  . Movement disorder 08/22/2014   Outpatient Encounter Prescriptions as of 04/02/2016  Medication Sig  . cholecalciferol (VITAMIN D) 1000 units tablet Take 1,000 Units by mouth daily.  Marland Kitchen MAGNESIUM CITRATE PO Take by mouth.  . pramipexole (MIRAPEX) 0.5 MG tablet Take 1 tablet (0.5 mg total) by mouth 3 (three) times daily.   No facility-administered encounter medications on file as of 04/02/2016.       Objective:   Physical Exam  Constitutional: He is oriented to person, place, and time. He appears well-developed and well-nourished. No distress.  HENT:  Head: Normocephalic and atraumatic.  Nose: Nose normal.  Mouth/Throat: Oropharynx is clear and moist. No oropharyngeal exudate.  Bilateral ears cerumen  Eyes: Conjunctivae and EOM are normal. Pupils are equal, round, and reactive to light. Right eye exhibits no discharge. Left eye exhibits no discharge. No scleral icterus.  Patient gets his eyes examined every 2 years  Neck: Normal range of motion. Neck supple. No thyromegaly present.  No bruits or thyromegaly  Cardiovascular: Normal rate, regular rhythm, normal heart sounds and intact distal pulses.  Exam reveals no gallop and no friction rub.   No murmur heard. Heart has a regular rate and rhythm at 60/m  Pulmonary/Chest: Effort normal and breath sounds normal. No respiratory distress. He has no wheezes. He has no rales.  He exhibits no tenderness.  Chest is clear anteriorly and posteriorly no axillary adenopathy  Abdominal: Soft. Bowel sounds are normal. He exhibits no mass. There is no tenderness. There is no rebound and no guarding.  No abdominal tenderness liver or spleen enlargement bruits or masses. No inguinal adenopathy.  Genitourinary: Rectum normal and penis normal.  The prostate is enlarged but soft and smooth  without lumps or masses. There are no rectal masses. There were no inguinal hernias palpable. External genitalia were within normal limits.  Musculoskeletal: Normal range of motion. He exhibits no edema or tenderness.  Lymphadenopathy:    He has no cervical adenopathy.  Neurological: He is alert and oriented to person, place, and time. He has normal reflexes. No cranial nerve deficit.  Minimal signs of tremor or rigidity today.  Skin: Skin is warm and dry. No rash noted.  Psychiatric: He has a normal mood and affect. His behavior is normal. Judgment and thought content normal.  Nursing note and vitals reviewed.   BP 105/73 mmHg  Pulse 70  Temp(Src) 97 F (36.1 C) (Oral)  Ht 5' 8"  (1.727 m)  Wt 142 lb 12.8 oz (64.774 kg)  BMI 21.72 kg/m2   [facility] reading (PRIMARY) by  Dr.Moore-chest x-ray with results pending                                       Assessment & Plan:  1. Hyperlipidemia -Continue with aggressive therapeutic lifestyle changes pending results of lab work - CBC with Differential/Platelet - BMP8+EGFR - Hepatic function panel  2. BPH (benign prostatic hyperplasia) -The patient has an enlarged prostate but he has no symptoms with voiding. - PSA, total and free  3. Annual physical exam -Routine lab work -Chest x-ray -Return FOBT -Use Debrox and irrigate for ear cerumen as directed -Schedule visit with the gastroenterologist for possible 10 year repeat colonoscopy because of father having had colon polyps  4. Vitamin D deficiency -Continue vitamin D replacement pending results of lab work - VITAMIN D 25 Hydroxy (Vit-D Deficiency, Fractures)  5. Other fatigue -Continue with aggressive therapeutic lifestyle changes and regular exercise - Thyroid Panel With TSH  6. Special screening for malignant neoplasms, colon - Fecal occult blood, imunochemical; Future Patient Instructions  Continue to follow-up with neurology We will arrange for a visit with the  gastroenterologist to see if you need another colonoscopy. Please keep this appointment Exercise regularly Continue current medications We will call with lab work results as soon as everything is returned   Arrie Senate MD

## 2016-04-03 ENCOUNTER — Encounter (INDEPENDENT_AMBULATORY_CARE_PROVIDER_SITE_OTHER): Payer: Self-pay | Admitting: *Deleted

## 2016-04-03 LAB — CBC WITH DIFFERENTIAL/PLATELET
BASOS ABS: 0 10*3/uL (ref 0.0–0.2)
Basos: 0 %
EOS (ABSOLUTE): 0 10*3/uL (ref 0.0–0.4)
Eos: 1 %
HEMATOCRIT: 42.9 % (ref 37.5–51.0)
Hemoglobin: 14.4 g/dL (ref 12.6–17.7)
IMMATURE GRANULOCYTES: 0 %
Immature Grans (Abs): 0 10*3/uL (ref 0.0–0.1)
LYMPHS ABS: 1.5 10*3/uL (ref 0.7–3.1)
Lymphs: 32 %
MCH: 30.8 pg (ref 26.6–33.0)
MCHC: 33.6 g/dL (ref 31.5–35.7)
MCV: 92 fL (ref 79–97)
MONOCYTES: 7 %
MONOS ABS: 0.3 10*3/uL (ref 0.1–0.9)
Neutrophils Absolute: 2.7 10*3/uL (ref 1.4–7.0)
Neutrophils: 60 %
PLATELETS: 191 10*3/uL (ref 150–379)
RBC: 4.68 x10E6/uL (ref 4.14–5.80)
RDW: 14.3 % (ref 12.3–15.4)
WBC: 4.6 10*3/uL (ref 3.4–10.8)

## 2016-04-03 LAB — HEPATIC FUNCTION PANEL
ALBUMIN: 4.2 g/dL (ref 3.6–4.8)
ALK PHOS: 74 IU/L (ref 39–117)
ALT: 10 IU/L (ref 0–44)
AST: 18 IU/L (ref 0–40)
Bilirubin Total: 0.4 mg/dL (ref 0.0–1.2)
Bilirubin, Direct: 0.13 mg/dL (ref 0.00–0.40)
Total Protein: 7 g/dL (ref 6.0–8.5)

## 2016-04-03 LAB — THYROID PANEL WITH TSH
Free Thyroxine Index: 1.8 (ref 1.2–4.9)
T3 Uptake Ratio: 28 % (ref 24–39)
T4, Total: 6.3 ug/dL (ref 4.5–12.0)
TSH: 3.19 u[IU]/mL (ref 0.450–4.500)

## 2016-04-03 LAB — BMP8+EGFR
BUN/Creatinine Ratio: 15 (ref 10–24)
BUN: 14 mg/dL (ref 8–27)
CALCIUM: 9.3 mg/dL (ref 8.6–10.2)
CO2: 25 mmol/L (ref 18–29)
Chloride: 99 mmol/L (ref 96–106)
Creatinine, Ser: 0.95 mg/dL (ref 0.76–1.27)
GFR calc Af Amer: 100 mL/min/{1.73_m2} (ref >59)
GFR calc non Af Amer: 87 mL/min/{1.73_m2} (ref >59)
GLUCOSE: 94 mg/dL (ref 65–99)
POTASSIUM: 4.2 mmol/L (ref 3.5–5.2)
SODIUM: 140 mmol/L (ref 134–144)

## 2016-04-03 LAB — VITAMIN D 25 HYDROXY (VIT D DEFICIENCY, FRACTURES): Vit D, 25-Hydroxy: 35.5 ng/mL (ref 30.0–100.0)

## 2016-04-03 LAB — PSA, TOTAL AND FREE
PROSTATE SPECIFIC AG, SERUM: 0.6 ng/mL (ref 0.0–4.0)
PSA FREE: 0.35 ng/mL
PSA, Free Pct: 58.3 %

## 2016-05-01 IMAGING — CR DG CHEST 2V
2 series · 2 of 2 positions shown · non-contrast
Comparison: To the/ [DATE].

CLINICAL DATA: Initial encounter for annual physical exam.

EXAM:
CHEST  2 VIEW

[view not recorded (1 of 2)]
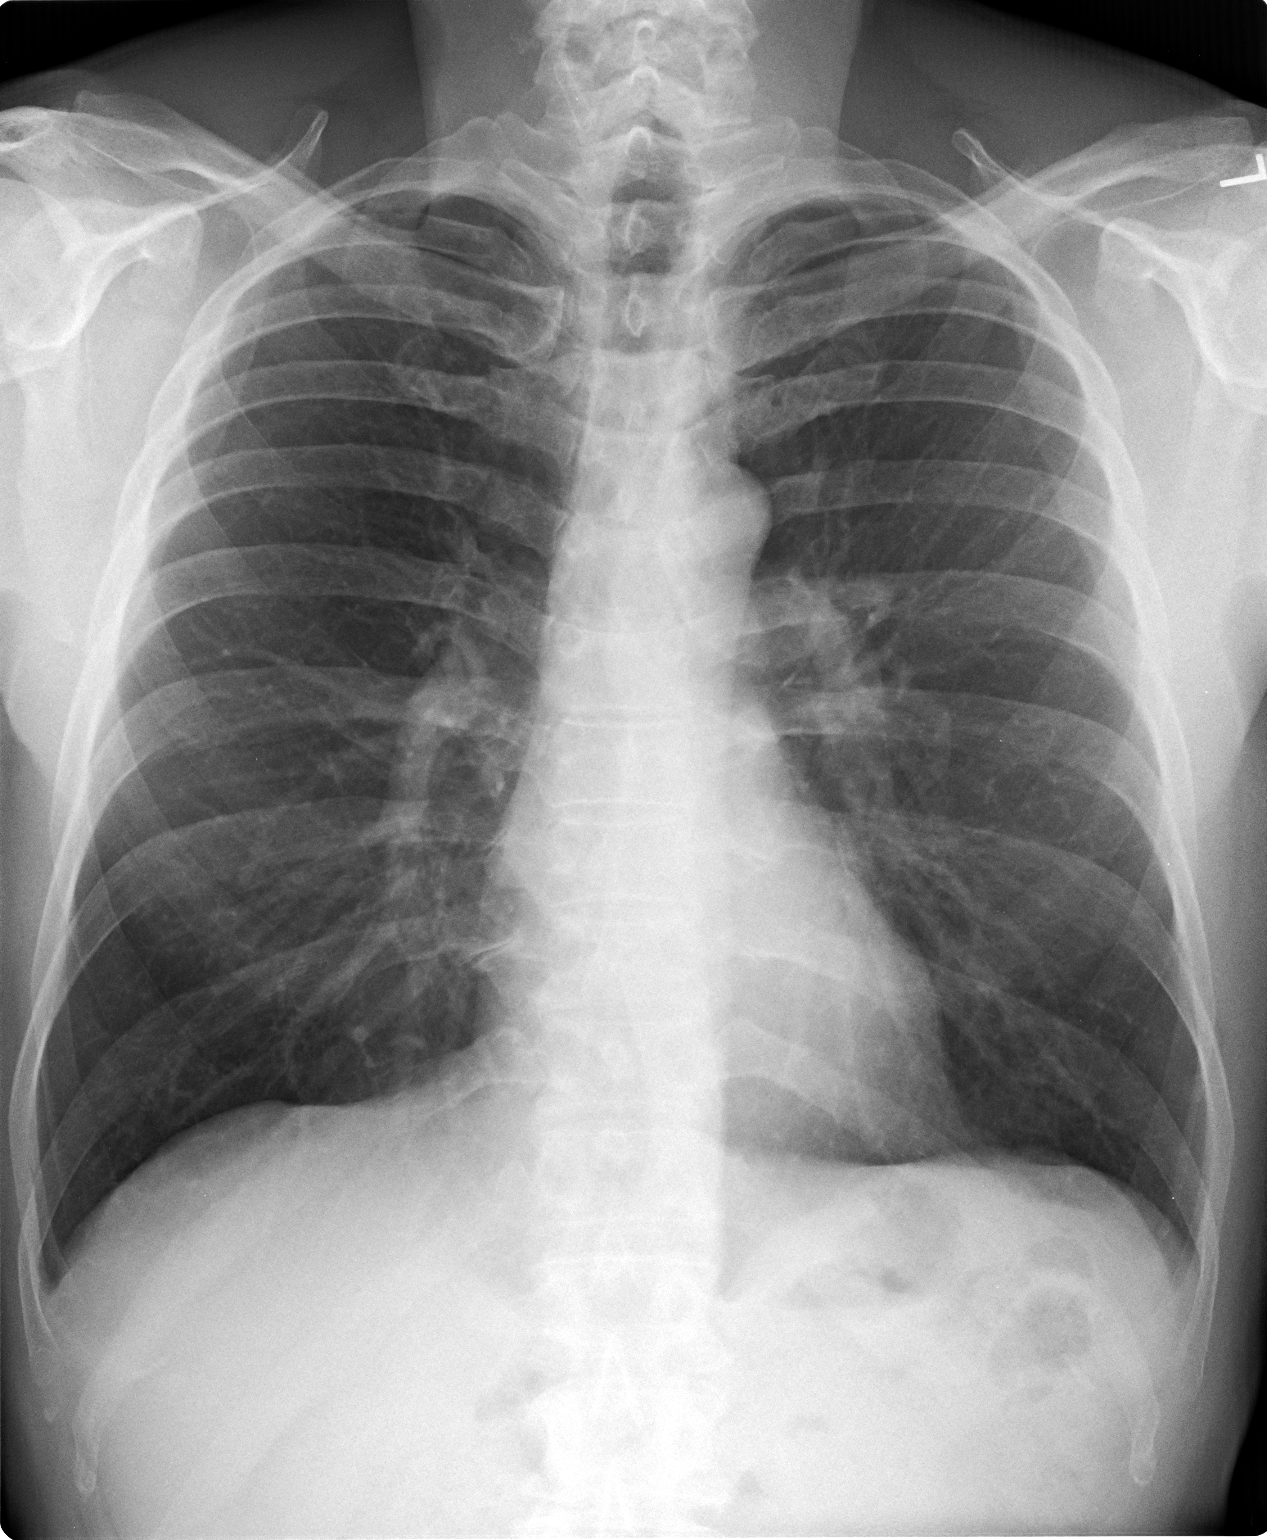

[view not recorded (2 of 2)]
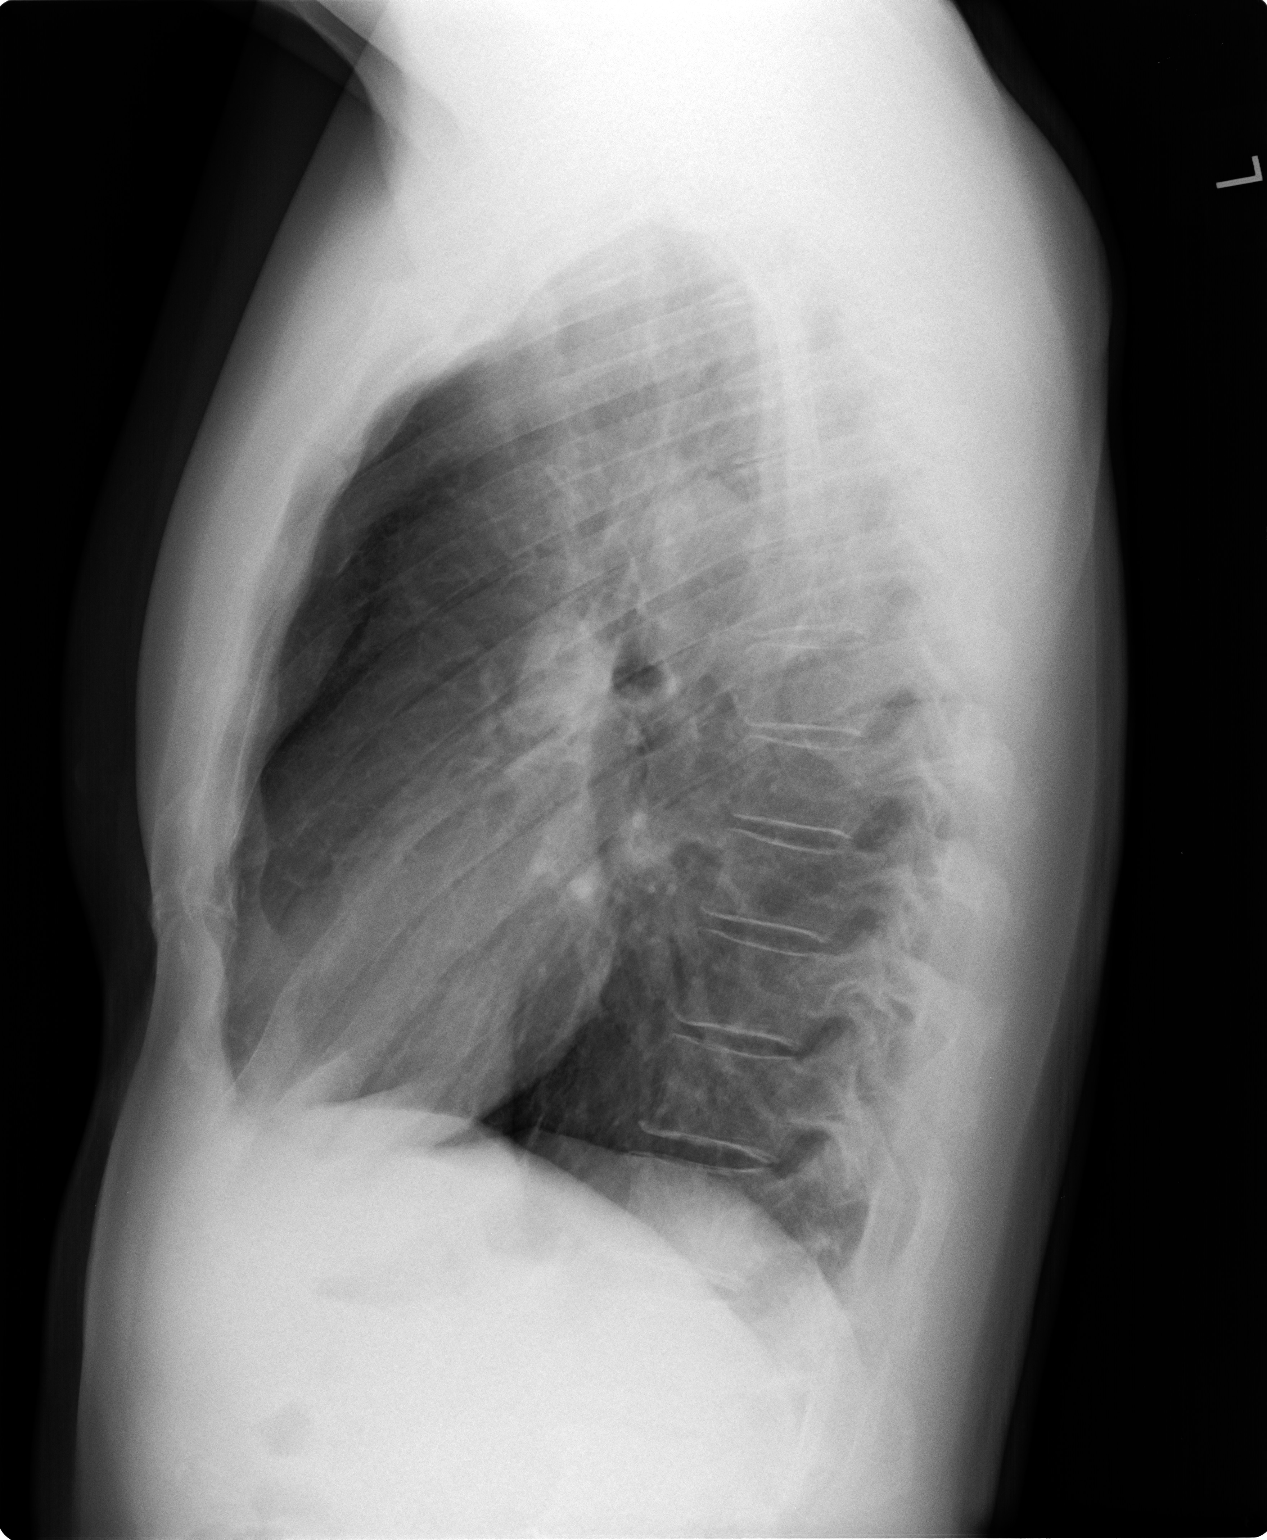

[2 of 2 positions shown; findings below may reference images not displayed]

FINDINGS: The lungs are clear without focal infiltrate, edema, pneumothorax or
pleural effusion. The cardiopericardial silhouette is within normal
limits for size. Imaged bony structures of the thorax are intact.
IMPRESSION: Normal exam.

## 2016-05-23 ENCOUNTER — Telehealth: Payer: Self-pay | Admitting: Neurology

## 2016-05-23 NOTE — Telephone Encounter (Signed)
Appt made with patient.  

## 2016-05-23 NOTE — Telephone Encounter (Signed)
Patient needs to talk to someone about his medication please call [phone]

## 2016-05-23 NOTE — Telephone Encounter (Signed)
I have some f/u appts mond/tues.  Can he come in then to discuss?

## 2016-05-23 NOTE — Progress Notes (Signed)
Aaron Robertson was seen today in the movement disorders clinic for neurologic consultation at the request of [name], MD.  The consultation is for the evaluation of tremor, voice changes and gait change.   Sx's have been going on for 6 months.  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  He states that he would concentrate on it and it would go away.  It actually has been better over the last few weeks/months.  In fact, he states that most of the above sx's are better than they were a few months ago.  02/16/15 update:  The patient presents today for follow-up.  He was diagnosed with Parkinson's disease last visit, but refuses all medication for it, as he unfortunately believes that starting medication would speed up the degenerative process, even though no literature supports this.  He also states today that he just really doesn't feel he needs it.  He cannot tell that he is rigid and generally feels well most days.  Pt brings in an "extra" video clip that lasted a full 6 minutes that he asked me to look at with him regarding the "protandim" that he is now taking.  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  Occasionally rides on the bike.  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  Thinks that there may be a buyout this summer at work and thinks that he may take that if it is an option.  States that tremor is better.    06/29/15 update:  The patient returns today for follow-up.  He has a history of Parkinson's disease and has been fairly rigid, but refuses medications.  He denies hallucinations.  He denies falls.  He denies any near syncopal episodes since our last visit.  I did review records since last visit.  He underwent a right inguinal hernia repair on May 15, 2015.  States that he didn't go back to work.   He is noting more tremor especially when the hand is at rest and worries about that at work, as he is working with chemicals.  He is noting a loss of voice.    11/20/15 update:  The patient returns today for follow-up.  He is on no medication for Parkinson's disease, as he has refused medication thus far.  He refused LSVT loud at our last visit as well.  I have filled out disability papers on him as he felt that he was no longer able to work.  He is frustrated that his Social Security disability got denied; states that he was examined by a SS IM doctor and a ENT and it was thought that it would go through.  He was previously working with chemicals and felt it unsafe to do so.  Notes that he has R hand tremor when laying in bed.  Reading about supplements and marijuana plants.  When asked why he doesn't want to consider traditional meds, he says that he hasn't read about those (only reads about SE of them).  Asks me about those today.  Having more trouble with buttoning buttons, pulling pants up.    02/20/16 update:  Pt returns for f/u re: PD.  The patient was started on pramipexole last visit and work to 0.5 mg 3 times a  day.  The patient reports good benefit with the medication.  He states that "it is a miracle drug."    He denies compulsive behaviors.  Denies sleep attacks.  Will have mild tremor if nervous or walking.  He can button clothing now and "pull my pants up like I am normal."  Admits that his wife moved out and they are in counseling so that has been a bit stressful.  States that he is losing weight because of that but states that he is eating.  States that he got down to 135 but has gained some weight back.   He was referred for physical therapy since last visit but refused the service.  He wanted speech therapy, but then refused the surface because of financial constraints.  He denies any falls but sometimes drags the feet.  Denies lightheadedness or near syncope.  Denies hallucinations.  Is doing  push ups but hasnt started biking yet.    05/27/16 update:  The patient returns today for follow-up, somewhat earlier than expected.  He remains on pramipexole 0.5 mg 3 times per day.  He had a physical exam since our last visit and I reviewed that data.  He states that his daughter is going to be getting married soon (06/15/16) and he is planning on walking her down the aisle and just wants to avoid any tremor during that situation.  He asks about potentially increasing his medication.  He denies any hallucinations.  He denies compulsive behavior.  He denies lightheadedness or near-syncope.  He continues to exercise.  Reports that he is still under stress regarding fact that wife left him.  Has signed separation agreement.  He did see counselor.   Has been turned down two times for SS disability.  Is still appealing that.   He asks me about refilling his valtrex.  Neuroimaging has not previously been performed.   PREVIOUS MEDICATIONS: none to date  ALLERGIES:  No Known Allergies  CURRENT MEDICATIONS:  Outpatient Encounter Prescriptions as of 05/27/2016  Medication Sig  . cholecalciferol (VITAMIN D) 1000 units tablet Take 1,000 Units by mouth daily.  Marland Kitchen MAGNESIUM CITRATE PO Take by mouth.  . pramipexole (MIRAPEX) 0.5 MG tablet Take 1 tablet (0.5 mg total) by mouth 3 (three) times daily.   No facility-administered encounter medications on file as of 05/27/2016.     PAST MEDICAL HISTORY:   Past Medical History:  Diagnosis Date  . Inguinal hernia 04/2015   right  . Neuromuscular disorder ([place])    Parkinson's disease  . Parkinson's disease ([place])     PAST SURGICAL HISTORY:   Past Surgical History:  Procedure Laterality Date  . COLONOSCOPY    . INGUINAL HERNIA REPAIR Right 05/15/2015   Procedure: OPEN RIGHT INGUINAL HERNIA REPAIR ;  Surgeon: Fanny Skates, MD;  Location: [facility];  Service: General;  Laterality: Right;  . INSERTION OF MESH Right 05/15/2015   Procedure: INSERTION  OF MESH;  Surgeon: Fanny Skates, MD;  Location: [facility];  Service: General;  Laterality: Right;    SOCIAL HISTORY:   Social History   Social History  . Marital status: Married    Spouse name: N/A  . Number of children: N/A  . Years of education: N/A   Occupational History  . Not on file.   Social History Main Topics  . Smoking status: Never Smoker  . Smokeless tobacco: Never Used  . Alcohol use 0.0 oz/week     Comment: occasionally  .  Drug use: No  . Sexual activity: Not on file   Other Topics Concern  . Not on file   Social History Narrative  . No narrative on file    FAMILY HISTORY:   Family Status  Relation Status  . Mother Alive   heart disease  . Maternal Uncle Deceased  . Father Deceased   alzheimer's   . Paternal Grandmother Deceased   ALS  . Sister Alive   hyperlipidemia  . Sister Alive   hyperlipidemia  . Son Alive   healthy  . Daughter Alive   healthy    ROS:  A complete 10 system review of systems was obtained and was unremarkable apart from what is mentioned above.  PHYSICAL EXAMINATION:    VITALS:   Vitals:   05/27/16 0849  BP: 120/80  Pulse: 68  Weight: 149 lb (67.6 kg)  Height: 5\' 8"  (1.727 m)   Wt Readings from Last 3 Encounters:  05/27/16 149 lb (67.6 kg)  04/02/16 142 lb 12.8 oz (64.8 kg)  02/20/16 143 lb (64.9 kg)     GEN:  The patient appears stated age and is in NAD. HEENT:  Normocephalic, atraumatic.  The mucous membranes are moist. The superficial temporal arteries are without ropiness or tenderness. CV:  RRR Lungs:  CTAB Neck/HEME:  There are no carotid bruits bilaterally.  Neurological examination:  Orientation: The patient is alert and oriented x3.  Cranial nerves: There is good facial symmetry. There is significant facial hypomimia.   The visual fields are full to confrontational testing. The speech is fluent and clear. There is hypophonic speech.  Soft palate rises symmetrically and there is  no tongue deviation. Hearing is intact to conversational tone. Sensation: Sensation is intact to light touch throughout. Motor: Strength is 5/5 in the bilateral upper and lower extremities.   Shoulder shrug is equal and symmetric.  There is no pronator drift.   Movement examination: Tone: There is mild rigidity with RUE Abnormal movements: RUE noted with ambulation only Coordination:  There is no decremation with RAM's, with any form of RAMS, including alternating supination and pronation of the forearm, hand opening and closing, finger taps, heel taps and toe taps. Gait and Station: The patient rises up without the use of his hands on the 1rst attempt.  He is not shuffling.  Increase in the RUE tremor with ambulation.  Negative pull test.    LABS:    Chemistry      Component Value Date/Time   NA 140 04/02/2016 1101   K 4.2 04/02/2016 1101   CL 99 04/02/2016 1101   CO2 25 04/02/2016 1101   BUN 14 04/02/2016 1101   CREATININE 0.95 04/02/2016 1101      Component Value Date/Time   CALCIUM 9.3 04/02/2016 1101   ALKPHOS 74 04/02/2016 1101   AST 18 04/02/2016 1101   ALT 10 04/02/2016 1101   BILITOT 0.4 04/02/2016 1101     Lab Results  Component Value Date   TSH 3.190 04/02/2016     ASSESSMENT/PLAN:  1.  Idiopathic Parkinson's disease, diagnosed in December, 2015  -Has had difficulty affording therapy so hasn't gone.  -Told him he could try to increase pramipexole 0.5 mg, 2 in the AM, 1 in the afternoon, 1 in the PM.   Risks, benefits, side effects and alternative therapies were discussed including but not limited to sleep attacks and compulsive behaviors.  The opportunity to ask questions was given and they were answered to the best of  my ability.  The patient expressed understanding and willingness to follow the outlined treatment protocols.  -told him he could try a levodopa prior to walking daughter down the aisle but he is very resistant to that as he believes that levodopa is  inherently bad for you.  I tried to dispell that myth.   In the end, he decided to try the levodopa and see how it affected the tremor and will just try it for the time around the wedding.    -I again stressed to him about the importance of safe, cardiovascular exercise.  He is doing lots of push ups but would like to see a bit of CV exercise as well.  2.  Depression  -doing a bit better but still very frustrated about wife leaving him. 3.  HS-2  -Needs to go to PCP for refill of valtrex 3.  I will see the patient back in 3-4 months, but I am happy to see him before that if new neurologic issues arise.  Much greater than 50% of this visit was spent in counseling and coordinating care.  Total face to face time:  30

## 2016-05-23 NOTE — Telephone Encounter (Signed)
Patient called back. He said he will be un available for the next hour. He was calling because his daughter is getting married in a few weeks and his Tremors have been getting worse with his Right arm when he walks. He was hoping to have his medication increased or changed. He is concerned of the Tremors while walking his daughter down the isle. His # is [phone]. Thank you

## 2016-05-23 NOTE — Telephone Encounter (Signed)
Patient is currently on Pramipexole 0.5 mg - 1 tablet TID. Please advise.

## 2016-05-27 ENCOUNTER — Ambulatory Visit (INDEPENDENT_AMBULATORY_CARE_PROVIDER_SITE_OTHER): Payer: 59 | Admitting: Neurology

## 2016-05-27 ENCOUNTER — Encounter: Payer: Self-pay | Admitting: Neurology

## 2016-05-27 VITALS — BP 120/80 | HR 68 | Ht 68.0 in | Wt 149.0 lb

## 2016-05-27 DIAGNOSIS — F330 Major depressive disorder, recurrent, mild: Secondary | ICD-10-CM

## 2016-05-27 DIAGNOSIS — G20 Parkinson's disease: Secondary | ICD-10-CM

## 2016-05-27 MED ORDER — CARBIDOPA-LEVODOPA 25-100 MG PO TABS: 1.0000 | ORAL_TABLET | Freq: Three times a day (TID) | ORAL | 0 refills | Status: DC

## 2016-05-27 NOTE — Patient Instructions (Signed)
1.  Increase pramipexole to 2 tablets in the AM, 1 in the afternoon and 1 in the evening.  Call [facility] when you need a new RX 2.  You can try carbidopa/levodopa 25/100 to see if it helps the tremor.  Trial it before the wedding to see how you react.  When you first take it, you want to trial a 1/2 tablet (it may not be enough but you want to make sure that you tolerate it).  Take it about 1 hour before the time you want to see if it will help the tremor 3.  Change your 06/24/16 appt and instead make it for 3-4 months from now. 4.  Enjoy your daughters wedding

## 2016-06-24 ENCOUNTER — Ambulatory Visit: Payer: 59 | Admitting: Neurology

## 2016-06-25 ENCOUNTER — Other Ambulatory Visit: Payer: Self-pay | Admitting: Neurology

## 2016-07-25 ENCOUNTER — Other Ambulatory Visit: Payer: Self-pay | Admitting: Neurology

## 2016-08-19 ENCOUNTER — Other Ambulatory Visit: Payer: Self-pay | Admitting: Neurology

## 2016-08-19 ENCOUNTER — Telehealth: Payer: Self-pay | Admitting: Family Medicine

## 2016-08-19 MED ORDER — VALACYCLOVIR HCL 500 MG PO TABS: 500.0000 mg | ORAL_TABLET | Freq: Two times a day (BID) | ORAL | 2 refills | Status: DC

## 2016-08-19 NOTE — Telephone Encounter (Signed)
Pharm aware that we've never filled this === they state they have never filled it either.  Per [facility] -outside of work  Call in meds for pt to keep on hand

## 2016-08-22 ENCOUNTER — Other Ambulatory Visit: Payer: Self-pay | Admitting: Neurology

## 2016-09-27 NOTE — Progress Notes (Signed)
Aaron Robertson was seen today in the movement disorders clinic for neurologic consultation at the request of [name], MD.  The consultation is for the evaluation of tremor, voice changes and gait change.   Sx's have been going on for 6 months.  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  He states that he would concentrate on it and it would go away.  It actually has been better over the last few weeks/months.  In fact, he states that most of the above sx's are better than they were a few months ago.  02/16/15 update:  The patient presents today for follow-up.  He was diagnosed with Parkinson's disease last visit, but refuses all medication for it, as he unfortunately believes that starting medication would speed up the degenerative process, even though no literature supports this.  He also states today that he just really doesn't feel he needs it.  He cannot tell that he is rigid and generally feels well most days.  Pt brings in an "extra" video clip that lasted a full 6 minutes that he asked me to look at with him regarding the "protandim" that he is now taking.  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  Occasionally rides on the bike.  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  Thinks that there may be a buyout this summer at work and thinks that he may take that if it is an option.  States that tremor is better.    06/29/15 update:  The patient returns today for follow-up.  He has a history of Parkinson's disease and has been fairly rigid, but refuses medications.  He denies hallucinations.  He denies falls.  He denies any near syncopal episodes since our last visit.  I did review records since last visit.  He underwent a right inguinal hernia repair on May 15, 2015.  States that he didn't go back to work.   He is noting more tremor especially when the hand is at rest and worries about that at work, as he is working with chemicals.  He is noting a loss of voice.    11/20/15 update:  The patient returns today for follow-up.  He is on no medication for Parkinson's disease, as he has refused medication thus far.  He refused LSVT loud at our last visit as well.  I have filled out disability papers on him as he felt that he was no longer able to work.  He is frustrated that his Social Security disability got denied; states that he was examined by a SS IM doctor and a ENT and it was thought that it would go through.  He was previously working with chemicals and felt it unsafe to do so.  Notes that he has R hand tremor when laying in bed.  Reading about supplements and marijuana plants.  When asked why he doesn't want to consider traditional meds, he says that he hasn't read about those (only reads about SE of them).  Asks me about those today.  Having more trouble with buttoning buttons, pulling pants up.    02/20/16 update:  Pt returns for f/u re: PD.  The patient was started on pramipexole last visit and work to 0.5 mg 3 times a  day.  The patient reports good benefit with the medication.  He states that "it is a miracle drug."    He denies compulsive behaviors.  Denies sleep attacks.  Will have mild tremor if nervous or walking.  He can button clothing now and "pull my pants up like I am normal."  Admits that his wife moved out and they are in counseling so that has been a bit stressful.  States that he is losing weight because of that but states that he is eating.  States that he got down to 135 but has gained some weight back.   He was referred for physical therapy since last visit but refused the service.  He wanted speech therapy, but then refused the surface because of financial constraints.  He denies any falls but sometimes drags the feet.  Denies lightheadedness or near syncope.  Denies hallucinations.  Is doing  push ups but hasnt started biking yet.    05/27/16 update:  The patient returns today for follow-up, somewhat earlier than expected.  He remains on pramipexole 0.5 mg 3 times per day.  He had a physical exam since our last visit and I reviewed that data.  He states that his daughter is going to be getting married soon (06/15/16) and he is planning on walking her down the aisle and just wants to avoid any tremor during that situation.  He asks about potentially increasing his medication.  He denies any hallucinations.  He denies compulsive behavior.  He denies lightheadedness or near-syncope.  He continues to exercise.  Reports that he is still under stress regarding fact that wife left him.  Has signed separation agreement.  He did see counselor.   Has been turned down two times for SS disability.  Is still appealing that.   He asks me about refilling his valtrex.  09/30/16 update:  The patient follows up today.  Last visit, I recommended that he increase pramipexole 0.5 mg so that he is taking 2 tablets in the morning, one in the afternoon and 1 in evening but he didn't do that and he is taking pramipexole 0.5 mg tid.    We did talk about taking levodopa just for his daughter's wedding so that he did not have so much tremor and he tells me that he started taking that 0.5 mg tid.  He thinks that it is working well.  Pt denies falls.  Pt denies lightheadedness, near syncope.  No hallucinations.  Mood has been better as he is now dating someone new.  He is doing lots of core exercises but not as much CV exercises.  States that "attitude is everything."  Still riding horses.    Neuroimaging has not previously been performed.   PREVIOUS MEDICATIONS: none to date  ALLERGIES:  No Known Allergies  CURRENT MEDICATIONS:  Outpatient Encounter Prescriptions as of 10/01/2016  Medication Sig  . carbidopa-levodopa (SINEMET IR) 25-100 MG tablet TAKE 1 TABLET BY MOUTH 3 TIMES DAILY.  . cholecalciferol (VITAMIN D) 1000  units tablet Take 1,000 Units by mouth daily.  Marland Kitchen MAGNESIUM CITRATE PO Take by mouth.  . pramipexole (MIRAPEX) 0.5 MG tablet 2 in the morning, 1 in the afternoon, 1 in the evening  . valACYclovir (VALTREX) 500 MG tablet Take 1 tablet (500 mg total) by mouth 2 (two) times daily. As directed   No facility-administered encounter medications on file as of 10/01/2016.     PAST MEDICAL HISTORY:   Past Medical History:  Diagnosis Date  .  Inguinal hernia 04/2015   right  . Neuromuscular disorder ([place])    Parkinson's disease  . Parkinson's disease ([place])     PAST SURGICAL HISTORY:   Past Surgical History:  Procedure Laterality Date  . COLONOSCOPY    . INGUINAL HERNIA REPAIR Right 05/15/2015   Procedure: OPEN RIGHT INGUINAL HERNIA REPAIR ;  Surgeon: Fanny Skates, MD;  Location: [facility];  Service: General;  Laterality: Right;  . INSERTION OF MESH Right 05/15/2015   Procedure: INSERTION OF MESH;  Surgeon: Fanny Skates, MD;  Location: [facility];  Service: General;  Laterality: Right;    SOCIAL HISTORY:   Social History   Social History  . Marital status: Married    Spouse name: N/A  . Number of children: N/A  . Years of education: N/A   Occupational History  . Not on file.   Social History Main Topics  . Smoking status: Never Smoker  . Smokeless tobacco: Never Used  . Alcohol use 0.0 oz/week     Comment: occasionally  . Drug use: No  . Sexual activity: Not on file   Other Topics Concern  . Not on file   Social History Narrative  . No narrative on file    FAMILY HISTORY:   Family Status  Relation Status  . Mother Alive   heart disease  . Maternal Uncle Deceased  . Father Deceased   alzheimer's   . Paternal Grandmother Deceased   ALS  . Sister Alive   hyperlipidemia  . Sister Alive   hyperlipidemia  . Son Alive   healthy  . Daughter Alive   healthy    ROS:  A complete 10 system review of systems was obtained and was  unremarkable apart from what is mentioned above.  PHYSICAL EXAMINATION:    VITALS:   There were no vitals filed for this visit. Wt Readings from Last 3 Encounters:  05/27/16 149 lb (67.6 kg)  04/02/16 142 lb 12.8 oz (64.8 kg)  02/20/16 143 lb (64.9 kg)     GEN:  The patient appears stated age and is in NAD. HEENT:  Normocephalic, atraumatic.  The mucous membranes are moist. The superficial temporal arteries are without ropiness or tenderness. CV:  RRR Lungs:  CTAB Neck/HEME:  There are no carotid bruits bilaterally.  Neurological examination:  Orientation: The patient is alert and oriented x3.  Cranial nerves: There is good facial symmetry. There is significant facial hypomimia.   The visual fields are full to confrontational testing. The speech is fluent and clear. There is hypophonic speech.  Soft palate rises symmetrically and there is no tongue deviation. Hearing is intact to conversational tone. Sensation: Sensation is intact to light touch throughout. Motor: Strength is 5/5 in the bilateral upper and lower extremities.   Shoulder shrug is equal and symmetric.  There is no pronator drift.   Movement examination: Tone: There is normal tone in the UE Abnormal movements: There is rare LUE tremor Coordination:  There is no decremation with RAM's, with any form of RAMS, including alternating supination and pronation of the forearm, hand opening and closing, finger taps, heel taps and toe taps. Gait and Station: The patient rises up without the use of his hands on the 1rst attempt.  He is not shuffling.  There is no tremor with ambulation today.   Negative pull test.    LABS:    Chemistry      Component Value Date/Time   NA 140 04/02/2016 1101  K 4.2 04/02/2016 1101   CL 99 04/02/2016 1101   CO2 25 04/02/2016 1101   BUN 14 04/02/2016 1101   CREATININE 0.95 04/02/2016 1101      Component Value Date/Time   CALCIUM 9.3 04/02/2016 1101   ALKPHOS 74 04/02/2016 1101   AST 18  04/02/2016 1101   ALT 10 04/02/2016 1101   BILITOT 0.4 04/02/2016 1101     Lab Results  Component Value Date   TSH 3.190 04/02/2016     ASSESSMENT/PLAN:  1.  Idiopathic Parkinson's disease, diagnosed in December, 2015  -Has had difficulty affording therapy so hasn't gone.  -Told him he could continue pramipexole 0.5 mg, 1 in the AM, 1 in the afternoon, 1 in the PM.   Risks, benefits, side effects and alternative therapies were discussed including but not limited to sleep attacks and compulsive behaviors.  The opportunity to ask questions was given and they were answered to the best of my ability.  The patient expressed understanding and willingness to follow the outlined treatment protocols.  -continue carbidopa/levodopa 25/100 tid.    -I again stressed to him about the importance of safe, cardiovascular exercise.  He is doing lots of push ups but would like to see a bit of CV exercise as well.  2.  Depression  -doing a bit better but still very frustrated about wife leaving him even though has a new girlfriend 3.  I will see the patient back in 6 months, but I am happy to see him before that if new neurologic issues arise.  Much greater than 50% of this visit was spent in counseling and coordinating care.  Total face to face time:  25

## 2016-10-01 ENCOUNTER — Encounter: Payer: Self-pay | Admitting: Neurology

## 2016-10-01 ENCOUNTER — Ambulatory Visit (INDEPENDENT_AMBULATORY_CARE_PROVIDER_SITE_OTHER): Payer: 59 | Admitting: Neurology

## 2016-10-01 VITALS — BP 120/64 | HR 70 | Ht 68.0 in | Wt 157.0 lb

## 2016-10-01 DIAGNOSIS — G20 Parkinson's disease: Secondary | ICD-10-CM | POA: Diagnosis not present

## 2016-11-14 ENCOUNTER — Other Ambulatory Visit: Payer: Self-pay | Admitting: Neurology

## 2016-11-17 ENCOUNTER — Other Ambulatory Visit: Payer: Self-pay | Admitting: Neurology

## 2017-02-15 ENCOUNTER — Other Ambulatory Visit: Payer: Self-pay | Admitting: Neurology

## 2017-03-24 NOTE — Progress Notes (Signed)
Aaron Robertson was seen today in the movement disorders clinic for neurologic consultation at the request of Chipper Herb, MD.  The consultation is for the evaluation of tremor, voice changes and gait change.   Sx's have been going on for 6 months.  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  He states that he would concentrate on it and it would go away.  It actually has been better over the last few weeks/months.  In fact, he states that most of the above sx's are better than they were a few months ago.  02/16/15 update:  The patient presents today for follow-up.  He was diagnosed with Parkinson's disease last visit, but refuses all medication for it, as he unfortunately believes that starting medication would speed up the degenerative process, even though no literature supports this.  He also states today that he just really doesn't feel he needs it.  He cannot tell that he is rigid and generally feels well most days.  Pt brings in an "extra" video clip that lasted a full 6 minutes that he asked me to look at with him regarding the "protandim" that he is now taking.  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  Occasionally rides on the bike.  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  Thinks that there may be a buyout this summer at work and thinks that he may take that if it is an option.  States that tremor is better.    06/29/15 update:  The patient returns today for follow-up.  He has a history of Parkinson's disease and has been fairly rigid, but refuses medications.  He denies hallucinations.  He denies falls.  He denies any near syncopal episodes since our last visit.  I did review records since last visit.  He underwent a right inguinal hernia repair on May 15, 2015.  States that he didn't go back to  work.  He is noting more tremor especially when the hand is at rest and worries about that at work, as he is working with chemicals.  He is noting a loss of voice.    11/20/15 update:  The patient returns today for follow-up.  He is on no medication for Parkinson's disease, as he has refused medication thus far.  He refused LSVT loud at our last visit as well.  I have filled out disability papers on him as he felt that he was no longer able to work.  He is frustrated that his Social Security disability got denied; states that he was examined by a SS IM doctor and a ENT and it was thought that it would go through.  He was previously working with chemicals and felt it unsafe to do so.  Notes that he has R hand tremor when laying in bed.  Reading about supplements and marijuana plants.  When asked why he doesn't want to consider traditional meds, he says that he hasn't read about those (only reads about SE of them).  Asks me about those today.  Having more trouble with buttoning buttons, pulling pants up.    02/20/16 update:  Pt returns for f/u re: PD.  The patient was started on pramipexole last visit and work to 0.5 mg 3 times  a day.  The patient reports good benefit with the medication.  He states that "it is a miracle drug."    He denies compulsive behaviors.  Denies sleep attacks.  Will have mild tremor if nervous or walking.  He can button clothing now and "pull my pants up like I am normal."  Admits that his wife moved out and they are in counseling so that has been a bit stressful.  States that he is losing weight because of that but states that he is eating.  States that he got down to 135 but has gained some weight back.   He was referred for physical therapy since last visit but refused the service.  He wanted speech therapy, but then refused the surface because of financial constraints.  He denies any falls but sometimes drags the feet.  Denies lightheadedness or near syncope.  Denies hallucinations.  Is  doing push ups but hasnt started biking yet.    05/27/16 update:  The patient returns today for follow-up, somewhat earlier than expected.  He remains on pramipexole 0.5 mg 3 times per day.  He had a physical exam since our last visit and I reviewed that data.  He states that his daughter is going to be getting married soon (06/15/16) and he is planning on walking her down the aisle and just wants to avoid any tremor during that situation.  He asks about potentially increasing his medication.  He denies any hallucinations.  He denies compulsive behavior.  He denies lightheadedness or near-syncope.  He continues to exercise.  Reports that he is still under stress regarding fact that wife left him.  Has signed separation agreement.  He did see counselor.   Has been turned down two times for SS disability.  Is still appealing that.   He asks me about refilling his valtrex.  09/30/16 update:  The patient follows up today.  Last visit, I recommended that he increase pramipexole 0.5 mg so that he is taking 2 tablets in the morning, one in the afternoon and 1 in evening but he didn't do that and he is taking pramipexole 0.5 mg tid.    We did talk about taking levodopa just for his daughter's wedding so that he did not have so much tremor and he tells me that he started taking that 0.5 mg tid.  He thinks that it is working well.  Pt denies falls.  Pt denies lightheadedness, near syncope.  No hallucinations.  Mood has been better as he is now dating someone new.  He is doing lots of core exercises but not as much CV exercises.  States that "attitude is everything."  Still riding horses.    04/01/17 update:  Patient seen today in follow-up.  The patient is on pramipexole, 0.5 mg 3 times per day.  The patient denies sleep attacks.  He denies compulsive behaviors.  No cognitive changes.  He denies any falls.  He remains on carbidopa/levodopa 25/100, one tablet 3 times per day.  No hallucinations.  His divorce was final  yesterday.  He asks me to fill out his long term disability form.  He still drools and drags feet but he feels great.  He states that he is exercising and is in the best shape of his life.  He asks how long he can expect this to continue.  Will rarely shuffle if he doesn't "think about it."  Noting some sleep fragmentation.  Neuroimaging has not previously been performed.   PREVIOUS  MEDICATIONS: none to date  ALLERGIES:  No Known Allergies  CURRENT MEDICATIONS:  Outpatient Encounter Prescriptions as of 04/01/2017  Medication Sig  . carbidopa-levodopa (SINEMET IR) 25-100 MG tablet TAKE 1 TABLET THREE TIMES A DAY  . cholecalciferol (VITAMIN D) 1000 units tablet Take 1,000 Units by mouth daily.  Marland Kitchen MAGNESIUM CITRATE PO Take by mouth.  . pramipexole (MIRAPEX) 0.5 MG tablet Take 1 tablet (0.5 mg total) by mouth 3 (three) times daily.   No facility-administered encounter medications on file as of 04/01/2017.     PAST MEDICAL HISTORY:   Past Medical History:  Diagnosis Date  . Inguinal hernia 04/2015   right  . Neuromuscular disorder ([place])    Parkinson's disease  . Parkinson's disease ([place])     PAST SURGICAL HISTORY:   Past Surgical History:  Procedure Laterality Date  . COLONOSCOPY    . INGUINAL HERNIA REPAIR Right 05/15/2015   Procedure: OPEN RIGHT INGUINAL HERNIA REPAIR ;  Surgeon: Fanny Skates, MD;  Location: [facility];  Service: General;  Laterality: Right;  . INSERTION OF MESH Right 05/15/2015   Procedure: INSERTION OF MESH;  Surgeon: Fanny Skates, MD;  Location: [facility];  Service: General;  Laterality: Right;    SOCIAL HISTORY:   Social History   Social History  . Marital status: Married    Spouse name: N/A  . Number of children: N/A  . Years of education: N/A   Occupational History  . Not on file.   Social History Main Topics  . Smoking status: Never Smoker  . Smokeless tobacco: Never Used  . Alcohol use 0.0 oz/week     Comment:  occasionally  . Drug use: No  . Sexual activity: Not on file   Other Topics Concern  . Not on file   Social History Narrative  . No narrative on file    FAMILY HISTORY:   Family Status  Relation Status  . Mother Alive       heart disease  . Mat Uncle Deceased  . Father Deceased       alzheimer's   . PGM Deceased       ALS  . Sister Alive       hyperlipidemia  . Sister Alive       hyperlipidemia  . Son Alive       healthy  . Daughter Alive       healthy    ROS:  A complete 10 system review of systems was obtained and was unremarkable apart from what is mentioned above.  PHYSICAL EXAMINATION:    VITALS:   Vitals:   04/01/17 0812  BP: 122/80  Pulse: 78  SpO2: 97%  Weight: 162 lb (73.5 kg)  Height: 5\' 8"  (1.727 m)   Wt Readings from Last 3 Encounters:  04/01/17 162 lb (73.5 kg)  10/01/16 157 lb (71.2 kg)  05/27/16 149 lb (67.6 kg)     GEN:  The patient appears stated age and is in NAD. HEENT:  Normocephalic, atraumatic.  The mucous membranes are moist. The superficial temporal arteries are without ropiness or tenderness. CV:  RRR Lungs:  CTAB Neck/HEME:  There are no carotid bruits bilaterally.  Neurological examination:  Orientation: The patient is alert and oriented x3.  Cranial nerves: There is good facial symmetry. There is significant facial hypomimia.   The visual fields are full to confrontational testing. The speech is fluent and clear. There is hypophonic speech.  Soft palate rises symmetrically and  there is no tongue deviation. Hearing is intact to conversational tone. Sensation: Sensation is intact to light touch throughout. Motor: Strength is 5/5 in the bilateral upper and lower extremities.   Shoulder shrug is equal and symmetric.  There is no pronator drift.   Movement examination: Tone: There is normal tone in the UE Abnormal movements: There is rare LUE tremor that is most notable with distraction procedures Coordination:  There is no  decremation with RAM's, with any form of RAMS, including alternating supination and pronation of the forearm, hand opening and closing, finger taps, heel taps and toe taps. Gait and Station: The patient rises up without the use of his hands on the 1rst attempt.  He is not shuffling.  There is no tremor with ambulation today.   Negative pull test.    LABS:    Chemistry      Component Value Date/Time   NA 140 04/02/2016 1101   K 4.2 04/02/2016 1101   CL 99 04/02/2016 1101   CO2 25 04/02/2016 1101   BUN 14 04/02/2016 1101   CREATININE 0.95 04/02/2016 1101      Component Value Date/Time   CALCIUM 9.3 04/02/2016 1101   ALKPHOS 74 04/02/2016 1101   AST 18 04/02/2016 1101   ALT 10 04/02/2016 1101   BILITOT 0.4 04/02/2016 1101     Lab Results  Component Value Date   TSH 3.190 04/02/2016     ASSESSMENT/PLAN:  1.  Idiopathic Parkinson's disease, diagnosed in December, 2015  -continue pramipexole 0.5 mg, 1 in the AM, 1 in the afternoon, 1 in the PM.   Risks, benefits, side effects and alternative therapies were discussed including but not limited to sleep attacks and compulsive behaviors.  The opportunity to ask questions was given and they were answered to the best of my ability.  The patient expressed understanding and willingness to follow the outlined treatment protocols.  -continue carbidopa/levodopa 25/100 tid.    -I again stressed to him about the importance of safe, cardiovascular exercise.    -disability forms taken and will fill this out 2.  Depression  -doing better now.  Was situational related.  Divorced formally as of yesterday 3.  Sleep fragmentation/insomnia  -talked about melatonin but he decided to hold on that.  Stated that he is managing with rare naps. 4.  I will see the patient back in 3 months (pt requests f/u in 3 months).   Much greater than 50% of this visit was spent in counseling and coordinating care.  Total face to face time:  25

## 2017-04-01 ENCOUNTER — Ambulatory Visit (INDEPENDENT_AMBULATORY_CARE_PROVIDER_SITE_OTHER): Payer: 59 | Admitting: Neurology

## 2017-04-01 ENCOUNTER — Encounter: Payer: Self-pay | Admitting: Neurology

## 2017-04-01 VITALS — BP 122/80 | HR 78 | Ht 68.0 in | Wt 162.0 lb

## 2017-04-01 DIAGNOSIS — G20 Parkinson's disease: Secondary | ICD-10-CM

## 2017-04-01 DIAGNOSIS — Z029 Encounter for administrative examinations, unspecified: Secondary | ICD-10-CM

## 2017-04-01 DIAGNOSIS — G4701 Insomnia due to medical condition: Secondary | ICD-10-CM | POA: Diagnosis not present

## 2017-05-17 ENCOUNTER — Other Ambulatory Visit: Payer: Self-pay | Admitting: Neurology

## 2017-05-18 ENCOUNTER — Other Ambulatory Visit: Payer: Self-pay | Admitting: Neurology

## 2017-07-04 ENCOUNTER — Encounter: Payer: Self-pay | Admitting: Family Medicine

## 2017-07-04 ENCOUNTER — Ambulatory Visit (INDEPENDENT_AMBULATORY_CARE_PROVIDER_SITE_OTHER): Payer: 59 | Admitting: Family Medicine

## 2017-07-04 VITALS — BP 90/55 | HR 79 | Temp 98.0°F | Ht 68.0 in | Wt 165.0 lb

## 2017-07-04 DIAGNOSIS — E7800 Pure hypercholesterolemia, unspecified: Secondary | ICD-10-CM

## 2017-07-04 DIAGNOSIS — G20 Parkinson's disease: Secondary | ICD-10-CM

## 2017-07-04 DIAGNOSIS — Z0000 Encounter for general adult medical examination without abnormal findings: Secondary | ICD-10-CM

## 2017-07-04 DIAGNOSIS — H6123 Impacted cerumen, bilateral: Secondary | ICD-10-CM | POA: Diagnosis not present

## 2017-07-04 DIAGNOSIS — E559 Vitamin D deficiency, unspecified: Secondary | ICD-10-CM

## 2017-07-04 DIAGNOSIS — N400 Enlarged prostate without lower urinary tract symptoms: Secondary | ICD-10-CM

## 2017-07-04 DIAGNOSIS — Z1211 Encounter for screening for malignant neoplasm of colon: Secondary | ICD-10-CM

## 2017-07-04 DIAGNOSIS — G20C Parkinsonism, unspecified: Secondary | ICD-10-CM

## 2017-07-04 DIAGNOSIS — J61 Pneumoconiosis due to asbestos and other mineral fibers: Secondary | ICD-10-CM

## 2017-07-04 LAB — URINALYSIS, COMPLETE
BILIRUBIN UA: NEGATIVE
GLUCOSE, UA: NEGATIVE
Leukocytes, UA: NEGATIVE
NITRITE UA: NEGATIVE
Protein, UA: NEGATIVE
RBC UA: NEGATIVE
Specific Gravity, UA: >1.03 — ABNORMAL HIGH (ref 1.005–1.030)
Urobilinogen, Ur: 0.2 mg/dL (ref 0.2–1.0)
pH, UA: 5 (ref 5.0–7.5)

## 2017-07-04 LAB — MICROSCOPIC EXAMINATION
Bacteria, UA: NONE SEEN
Epithelial Cells (non renal): NONE SEEN /hpf (ref 0–10)
RBC, UA: NONE SEEN /hpf (ref 0–?)
RENAL EPITHEL UA: NONE SEEN /HPF
WBC UA: NONE SEEN /HPF (ref 0–?)

## 2017-07-04 NOTE — Addendum Note (Signed)
Addended by: Zannie Cove on: 07/04/2017 03:44 PM   Modules accepted: Orders

## 2017-07-04 NOTE — Addendum Note (Signed)
Addended by: Zannie Cove on: 07/04/2017 03:46 PM   Modules accepted: Orders

## 2017-07-04 NOTE — Patient Instructions (Addendum)
Continue current medications. Continue good therapeutic lifestyle changes which include good diet and exercise. Fall precautions discussed with patient. If an FOBT was given today- please return it to our front desk. If you are over 62 years old - you may need Prevnar 84 or the adult Pneumonia vaccine.  **Flu shots are available--- please call and schedule a FLU-CLINIC appointment**  After your visit with [place] today you will receive a survey in the mail or online from [employer] regarding your care with [place]. Please take a moment to fill this out. Your feedback is very important to [place] as you can help [place] better understand your patient needs as well as improve your experience and satisfaction. WE CARE ABOUT YOU!!!   Continue to follow-up with neurology Get your flu shot by the end of October We will arrange for you to have an appointment with the gastroenterologist to discuss getting a routine colonoscopy Stay active physically and drink plenty of water and fluids Follow-up with [facility] on the asbestosis diagnosis

## 2017-07-04 NOTE — Progress Notes (Signed)
Subjective:    Patient ID: Aaron Robertson, male    DOB: [birth year], 61 y.o.   MRN: [id number]  HPI Patient is here today for annual wellness exam and follow up of chronic medical problems which includes hyperlipidemia. He is taking medication regularly.The patient is doing well overall. He continues to see the neurologist in [place] for his Parkinson's disease. His last colonoscopy was in November 2007 and he does not want to go back to have another one. He has a lesion on his abdomen that comes and goes. He'll return to the office for fasting lab work and will be given an FOBT to return. He'll also get an EKG today. The patient indicates she's been seeing the [facility] pulmonologist and has a mild case of asbestosis. He has not had a CT scan for this at this point in time but may end up getting one in the future. He denies any chest pain or shortness of breath. He sees the neurologist for his Parkinson's disease every 4-6 months. He is doing quite well with that and on Sinemet and Mirapex now. He says he feels well. He denies any chest pain or shortness of breath. He denies any trouble with his stomach including nausea vomiting diarrhea blood in the stool or black tarry bowel movements. He is due to get a routine colonoscopy in fact he is past due for this and we will set him up a visit to have this done. He denies any trouble with passing his water including burning pain or frequency. The patient has no erectile difficulties.   Patient Active Problem List   Diagnosis Date Noted  . Vitamin D deficiency 04/02/2016  . Right inguinal hernia 05/15/2015  . Hyperlipidemia 09/29/2014  . Parkinson's disease ([place]) 09/27/2014  . Other fatigue 08/22/2014  . Tremors of nervous system 08/22/2014  . Abnormality of gait 08/22/2014  . BPH (benign prostatic hyperplasia) 08/22/2014  . Movement disorder 08/22/2014   Outpatient Encounter Prescriptions as of 07/04/2017  Medication Sig  . carbidopa-levodopa  (SINEMET IR) 25-100 MG tablet TAKE 1 TABLET THREE TIMES A DAY  . cholecalciferol (VITAMIN D) 1000 units tablet Take 1,000 Units by mouth daily.  Marland Kitchen MAGNESIUM CITRATE PO Take by mouth.  . pramipexole (MIRAPEX) 0.5 MG tablet TAKE 1 TABLET (0.5 MG TOTAL) BY MOUTH 3 (THREE) TIMES DAILY.   No facility-administered encounter medications on file as of 07/04/2017.       Review of Systems  Constitutional: Negative.   HENT: Negative.   Eyes: Negative.   Respiratory: Negative.   Cardiovascular: Negative.   Gastrointestinal: Negative.   Endocrine: Negative.   Genitourinary: Negative.   Musculoskeletal: Negative.   Skin: Negative.        Lesion on abd - comes and goes  Allergic/Immunologic: Negative.   Neurological: Negative.   Hematological: Negative.   Psychiatric/Behavioral: Negative.        Objective:   Physical Exam  Constitutional: He is oriented to person, place, and time. He appears well-developed and well-nourished.  The patient is pleasant and alert with minimal if any tremor being noticed. He is positive in his demeanor and attitude.  HENT:  Head: Normocephalic and atraumatic.  Nose: Nose normal.  Mouth/Throat: Oropharynx is clear and moist. No oropharyngeal exudate.  Bilateral ears cerumen  Eyes: Pupils are equal, round, and reactive to light. Conjunctivae and EOM are normal. Right eye exhibits no discharge. Left eye exhibits no discharge. No scleral icterus.  Neck: Normal range of motion. Neck supple.  No thyromegaly present.  No bruits thyromegaly or anterior cervical adenopathy  Cardiovascular: Normal rate, regular rhythm, normal heart sounds and intact distal pulses.   No murmur heard. Heart has a regular rate and rhythm at 72/m  Pulmonary/Chest: Effort normal and breath sounds normal. No respiratory distress. He has no wheezes. He has no rales. He exhibits no tenderness.  No axillary adenopathy Clear anteriorly and posteriorly  Abdominal: Soft. Bowel sounds are normal.  He exhibits no mass. There is no tenderness. There is no rebound and no guarding.  No skin rashes or skin lesions noted. No liver or spleen enlargement. No masses palpable. No bruits present. No inguinal adenopathy.  Genitourinary: Rectum normal, prostate normal and penis normal.  Genitourinary Comments: The prostate is enlarged but soft and smooth. The rectal exam was negative for masses. External genitalia were within normal limits with no inguinal hernias being palpated.  Musculoskeletal: Normal range of motion. He exhibits no edema.  Lymphadenopathy:    He has no cervical adenopathy.  Neurological: He is alert and oriented to person, place, and time. He has normal reflexes. No cranial nerve deficit.  No tremor apparent with current medication and treatment.  Skin: Skin is warm and dry. No rash noted.  Psychiatric: He has a normal mood and affect. His behavior is normal. Judgment and thought content normal.  Nursing note and vitals reviewed.   BP (!) 90/55   Pulse 79   Temp 98 F (36.7 C) (Oral)   Ht 5' 8"  (1.727 m)   Wt 165 lb (74.8 kg)   BMI 25.09 kg/m   EKG with results pending===     Assessment & Plan:  1. Annual physical exam -The patient is doing well overall with his current diagnoses. He is being followed regularly by the pulmonologist from [place].. -He is also due to get a routine colonoscopy and we will arrange for him to get this. - Urinalysis, Complete - BMP8+EGFR; Future - CBC with Differential/Platelet; Future - Lipid panel; Future - PSA, total and free; Future - VITAMIN D 25 Hydroxy (Vit-D Deficiency, Fractures); Future - Hepatic function panel; Future  2. Benign prostatic hyperplasia, unspecified whether lower urinary tract symptoms present -The prostate is enlarged but soft and smooth and the patient is having no symptoms regarding this. - Urinalysis, Complete - CBC with Differential/Platelet; Future - PSA, total and free; Future  3. Pure  hypercholesterolemia -Continue with aggressive therapeutic lifestyle changes.The patient does have a history of hyperlipidemiacurrently not taking any medication for this. which include exercise and diet. - BMP8+EGFR; Future - CBC with Differential/Platelet; Future - Lipid panel; Future - Hepatic function panel; Future  4. Vitamin D deficiency -Continue with vitamin D replacement pending results of blood work - CBC with Differential/Platelet; Future - VITAMIN D 25 Hydroxy (Vit-D Deficiency, Fractures); Future  5. Primary Parkinsonism ([place]) -Continue follow-up with neurology and Sinemet and Mirapex as directed by the neurologist.  6. Asbestosis ([place]) -Continue to follow-up with pulmonologist from [place]  7. Excessive cerumen in ear canal, bilateral -Ear lavage bilaterally today to remove cerumen  No orders of the defined types were placed in this encounter.  Patient Instructions  Continue current medications. Continue good therapeutic lifestyle changes which include good diet and exercise. Fall precautions discussed with patient. If an FOBT was given today- please return it to our front desk. If you are over 15 years old - you may need Prevnar 72 or the adult Pneumonia vaccine.  **Flu shots are available---  please call and schedule a FLU-CLINIC appointment**  After your visit with [place] today you will receive a survey in the mail or online from [employer] regarding your care with [place]. Please take a moment to fill this out. Your feedback is very important to [place] as you can help [place] better understand your patient needs as well as improve your experience and satisfaction. WE CARE ABOUT YOU!!!   Continue to follow-up with neurology Get your flu shot by the end of October We will arrange for you to have an appointment with the gastroenterologist to discuss getting a routine colonoscopy Stay active physically and drink plenty of water and fluids Follow-up with [employer] on the  asbestosis diagnosis    Arrie Senate MD

## 2017-07-10 NOTE — Progress Notes (Signed)
Aaron Robertson was seen today in the movement disorders clinic for neurologic consultation at the request of Chipper Herb, MD.  The consultation is for the evaluation of tremor, voice changes and gait change.   Sx's have been going on for 6 months.  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  He states that he would concentrate on it and it would go away.  It actually has been better over the last few weeks/months.  In fact, he states that most of the above sx's are better than they were a few months ago.  02/16/15 update:  The patient presents today for follow-up.  He was diagnosed with Parkinson's disease last visit, but refuses all medication for it, as he unfortunately believes that starting medication would speed up the degenerative process, even though no literature supports this.  He also states today that he just really doesn't feel he needs it.  He cannot tell that he is rigid and generally feels well most days.  Pt brings in an "extra" video clip that lasted a full 6 minutes that he asked me to look at with him regarding the "protandim" that he is now taking.  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  Occasionally rides on the bike.  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  Thinks that there may be a buyout this summer at work and thinks that he may take that if it is an option.  States that tremor is better.    06/29/15 update:  The patient returns today for follow-up.  He has a history of Parkinson's disease and has been fairly rigid, but refuses medications.  He denies hallucinations.  He denies falls.  He denies any near syncopal episodes since our last visit.  I did review records since last visit.  He underwent a right inguinal hernia repair on May 15, 2015.  States that he didn't go back to  work.  He is noting more tremor especially when the hand is at rest and worries about that at work, as he is working with chemicals.  He is noting a loss of voice.    11/20/15 update:  The patient returns today for follow-up.  He is on no medication for Parkinson's disease, as he has refused medication thus far.  He refused LSVT loud at our last visit as well.  I have filled out disability papers on him as he felt that he was no longer able to work.  He is frustrated that his Social Security disability got denied; states that he was examined by a SS IM doctor and a ENT and it was thought that it would go through.  He was previously working with chemicals and felt it unsafe to do so.  Notes that he has R hand tremor when laying in bed.  Reading about supplements and marijuana plants.  When asked why he doesn't want to consider traditional meds, he says that he hasn't read about those (only reads about SE of them).  Asks me about those today.  Having more trouble with buttoning buttons, pulling pants up.    02/20/16 update:  Pt returns for f/u re: PD.  The patient was started on pramipexole last visit and work to 0.5 mg 3 times  a day.  The patient reports good benefit with the medication.  He states that "it is a miracle drug."    He denies compulsive behaviors.  Denies sleep attacks.  Will have mild tremor if nervous or walking.  He can button clothing now and "pull my pants up like I am normal."  Admits that his wife moved out and they are in counseling so that has been a bit stressful.  States that he is losing weight because of that but states that he is eating.  States that he got down to 135 but has gained some weight back.   He was referred for physical therapy since last visit but refused the service.  He wanted speech therapy, but then refused the surface because of financial constraints.  He denies any falls but sometimes drags the feet.  Denies lightheadedness or near syncope.  Denies hallucinations.  Is  doing push ups but hasnt started biking yet.    05/27/16 update:  The patient returns today for follow-up, somewhat earlier than expected.  He remains on pramipexole 0.5 mg 3 times per day.  He had a physical exam since our last visit and I reviewed that data.  He states that his daughter is going to be getting married soon (06/15/16) and he is planning on walking her down the aisle and just wants to avoid any tremor during that situation.  He asks about potentially increasing his medication.  He denies any hallucinations.  He denies compulsive behavior.  He denies lightheadedness or near-syncope.  He continues to exercise.  Reports that he is still under stress regarding fact that wife left him.  Has signed separation agreement.  He did see counselor.   Has been turned down two times for SS disability.  Is still appealing that.   He asks me about refilling his valtrex.  09/30/16 update:  The patient follows up today.  Last visit, I recommended that he increase pramipexole 0.5 mg so that he is taking 2 tablets in the morning, one in the afternoon and 1 in evening but he didn't do that and he is taking pramipexole 0.5 mg tid.    We did talk about taking levodopa just for his daughter's wedding so that he did not have so much tremor and he tells me that he started taking that 0.5 mg tid.  He thinks that it is working well.  Pt denies falls.  Pt denies lightheadedness, near syncope.  No hallucinations.  Mood has been better as he is now dating someone new.  He is doing lots of core exercises but not as much CV exercises.  States that "attitude is everything."  Still riding horses.    04/01/17 update:  Patient seen today in follow-up.  The patient is on pramipexole, 0.5 mg 3 times per day.  The patient denies sleep attacks.  He denies compulsive behaviors.  No cognitive changes.  He denies any falls.  He remains on carbidopa/levodopa 25/100, one tablet 3 times per day.  No hallucinations.  His divorce was final  yesterday.  He asks me to fill out his long term disability form.  He still drools and drags feet but he feels great.  He states that he is exercising and is in the best shape of his life.  He asks how long he can expect this to continue.  Will rarely shuffle if he doesn't "think about it."  Noting some sleep fragmentation.  07/14/17 update:  Patient in today in follow-up for  Parkinson's disease.  Patient is on pramipexole 0.5 mg 3 times a day and carbidopa/levodopa 25/100 tid.  "I wish I knew how to pick up my feet without thinking about it."  "It worries me."  No hallucinations.  No compulsive behaviors.  No sleep attacks.  No cognitive change.  Occasionally will note tremor in the right hand.  He denies falls.  He denies lightheadedness or near syncope.   He does push ups "fast and hard" but no other CV exercise.  Does better with stress now that divorce is over.  States that he was dx with asbestosis since last visit.    Neuroimaging has not previously been performed.   PREVIOUS MEDICATIONS: none to date  ALLERGIES:  No Known Allergies  CURRENT MEDICATIONS:  Outpatient Encounter Prescriptions as of 07/14/2017  Medication Sig  . carbidopa-levodopa (SINEMET IR) 25-100 MG tablet TAKE 1 TABLET THREE TIMES A DAY  . cholecalciferol (VITAMIN D) 1000 units tablet Take 1,000 Units by mouth daily.  Marland Kitchen MAGNESIUM CITRATE PO Take by mouth.  . pramipexole (MIRAPEX) 0.5 MG tablet TAKE 1 TABLET (0.5 MG TOTAL) BY MOUTH 3 (THREE) TIMES DAILY.   No facility-administered encounter medications on file as of 07/14/2017.     PAST MEDICAL HISTORY:   Past Medical History:  Diagnosis Date  . Inguinal hernia 04/2015   right  . Neuromuscular disorder ([place])    Parkinson's disease  . Parkinson's disease ([place])     PAST SURGICAL HISTORY:   Past Surgical History:  Procedure Laterality Date  . COLONOSCOPY    . INGUINAL HERNIA REPAIR Right 05/15/2015   Procedure: OPEN RIGHT INGUINAL HERNIA REPAIR ;  Surgeon: Fanny Skates, MD;  Location: [facility];  Service: General;  Laterality: Right;  . INSERTION OF MESH Right 05/15/2015   Procedure: INSERTION OF MESH;  Surgeon: Fanny Skates, MD;  Location: [facility];  Service: General;  Laterality: Right;    SOCIAL HISTORY:   Social History   Social History  . Marital status: Married    Spouse name: N/A  . Number of children: N/A  . Years of education: N/A   Occupational History  . Not on file.   Social History Main Topics  . Smoking status: Never Smoker  . Smokeless tobacco: Never Used  . Alcohol use 0.0 oz/week     Comment: occasionally  . Drug use: No  . Sexual activity: Not on file   Other Topics Concern  . Not on file   Social History Narrative  . No narrative on file    FAMILY HISTORY:   Family Status  Relation Status  . Mother Alive       heart disease  . Mat Uncle Deceased  . Father Deceased       alzheimer's   . PGM Deceased       ALS  . Sister Alive       hyperlipidemia  . Sister Alive       hyperlipidemia  . Son Alive       healthy  . Daughter Alive       healthy  . PGF (Not Specified)    ROS:  A complete 10 system review of systems was obtained and was unremarkable apart from what is mentioned above.  PHYSICAL EXAMINATION:    VITALS:   Vitals:   07/14/17 0809  BP: 130/62  Pulse: 74  SpO2: 98%  Weight: 164 lb (74.4 kg)  Height: 5\' 8"  (1.727 m)  Wt Readings from Last 3 Encounters:  07/14/17 164 lb (74.4 kg)  07/04/17 165 lb (74.8 kg)  04/01/17 162 lb (73.5 kg)     GEN:  The patient appears stated age and is in NAD. HEENT:  Normocephalic, atraumatic.  The mucous membranes are moist. The superficial temporal arteries are without ropiness or tenderness. CV:  RRR Lungs:  CTAB Neck/HEME:  There are no carotid bruits bilaterally.  Neurological examination:  Orientation: The patient is alert and oriented x3.  Cranial nerves: There is good facial symmetry. There is  significant facial hypomimia.   The visual fields are full to confrontational testing. The speech is fluent and clear. There is hypophonic speech.  Soft palate rises symmetrically and there is no tongue deviation. Hearing is intact to conversational tone. Sensation: Sensation is intact to light touch throughout. Motor: Strength is 5/5 in the bilateral upper and lower extremities.   Shoulder shrug is equal and symmetric.  There is no pronator drift.   Movement examination: Tone: There is trace rigidity in the RUE Abnormal movements: There is no tremor today Coordination:  There is slight decrease in finger taps on the right.  Otherwise RAMs are good.   Gait and Station: The patient rises up without the use of his hands on the 1rst attempt.  He is slightly drags the heels.  There is no tremor with ambulation today.   Negative pull test.    LABS:    Chemistry      Component Value Date/Time   NA 140 04/02/2016 1101   K 4.2 04/02/2016 1101   CL 99 04/02/2016 1101   CO2 25 04/02/2016 1101   BUN 14 04/02/2016 1101   CREATININE 0.95 04/02/2016 1101      Component Value Date/Time   CALCIUM 9.3 04/02/2016 1101   ALKPHOS 74 04/02/2016 1101   AST 18 04/02/2016 1101   ALT 10 04/02/2016 1101   BILITOT 0.4 04/02/2016 1101     Lab Results  Component Value Date   TSH 3.190 04/02/2016     ASSESSMENT/PLAN:  1.  Idiopathic Parkinson's disease, diagnosed in December, 2015  -continue pramipexole 0.5 mg, 1 in the AM, 1 in the afternoon, 1 in the PM.   Risks, benefits, side effects and alternative therapies were discussed including but not limited to sleep attacks and compulsive behaviors.  The opportunity to ask questions was given and they were answered to the best of my ability.  The patient expressed understanding and willingness to follow the outlined treatment protocols.  -continue carbidopa/levodopa 25/100 tid.    -talked about CV exercise and not just weight lifting exercises 2.   Depression  -doing better now.  Was situational related.  Divorced formally and has a girlfriend and feeling well in that regard 3.  Sleep fragmentation/insomnia  -doing better since divorce 4.  Much greater than 50% of this visit was spent in counseling and coordinating care.  Total face to face time:  25 min.  Follow up is anticipated in the next few months, sooner should new neurologic issues arise.

## 2017-07-14 ENCOUNTER — Ambulatory Visit (INDEPENDENT_AMBULATORY_CARE_PROVIDER_SITE_OTHER): Payer: 59 | Admitting: Neurology

## 2017-07-14 ENCOUNTER — Encounter: Payer: Self-pay | Admitting: Neurology

## 2017-07-14 VITALS — BP 130/62 | HR 74 | Ht 68.0 in | Wt 164.0 lb

## 2017-07-14 DIAGNOSIS — G20 Parkinson's disease: Secondary | ICD-10-CM

## 2017-07-14 MED ORDER — PRAMIPEXOLE DIHYDROCHLORIDE 0.5 MG PO TABS: 0.5000 mg | ORAL_TABLET | Freq: Three times a day (TID) | ORAL | 1 refills | Status: DC

## 2017-07-21 ENCOUNTER — Other Ambulatory Visit (INDEPENDENT_AMBULATORY_CARE_PROVIDER_SITE_OTHER): Payer: 59

## 2017-07-21 ENCOUNTER — Ambulatory Visit: Payer: 59

## 2017-07-21 DIAGNOSIS — E559 Vitamin D deficiency, unspecified: Secondary | ICD-10-CM

## 2017-07-21 DIAGNOSIS — Z23 Encounter for immunization: Secondary | ICD-10-CM

## 2017-07-21 DIAGNOSIS — E7800 Pure hypercholesterolemia, unspecified: Secondary | ICD-10-CM

## 2017-07-21 DIAGNOSIS — Z0000 Encounter for general adult medical examination without abnormal findings: Secondary | ICD-10-CM

## 2017-07-21 DIAGNOSIS — N400 Enlarged prostate without lower urinary tract symptoms: Secondary | ICD-10-CM

## 2017-07-22 LAB — CBC WITH DIFFERENTIAL/PLATELET
Basophils Absolute: 0 10*3/uL (ref 0.0–0.2)
Basos: 0 %
EOS (ABSOLUTE): 0 10*3/uL (ref 0.0–0.4)
Eos: 1 %
Hematocrit: 43.8 % (ref 37.5–51.0)
Hemoglobin: 15 g/dL (ref 13.0–17.7)
IMMATURE GRANS (ABS): 0 10*3/uL (ref 0.0–0.1)
Immature Granulocytes: 0 %
Lymphocytes Absolute: 1.1 10*3/uL (ref 0.7–3.1)
Lymphs: 32 %
MCH: 31.3 pg (ref 26.6–33.0)
MCHC: 34.2 g/dL (ref 31.5–35.7)
MCV: 91 fL (ref 79–97)
MONOS ABS: 0.3 10*3/uL (ref 0.1–0.9)
Monocytes: 8 %
NEUTROS ABS: 2.1 10*3/uL (ref 1.4–7.0)
NEUTROS PCT: 59 %
PLATELETS: 170 10*3/uL (ref 150–379)
RBC: 4.79 x10E6/uL (ref 4.14–5.80)
RDW: 13.7 % (ref 12.3–15.4)
WBC: 3.6 10*3/uL (ref 3.4–10.8)

## 2017-07-22 LAB — PSA, TOTAL AND FREE
PROSTATE SPECIFIC AG, SERUM: 0.5 ng/mL (ref 0.0–4.0)
PSA FREE: 0.32 ng/mL
PSA, Free Pct: 64 %

## 2017-07-22 LAB — BMP8+EGFR
BUN/Creatinine Ratio: 18 (ref 10–24)
BUN: 18 mg/dL (ref 8–27)
CO2: 27 mmol/L (ref 20–29)
Calcium: 9.3 mg/dL (ref 8.6–10.2)
Chloride: 100 mmol/L (ref 96–106)
Creatinine, Ser: 1 mg/dL (ref 0.76–1.27)
GFR calc Af Amer: 93 mL/min/{1.73_m2} (ref >59)
GFR calc non Af Amer: 81 mL/min/{1.73_m2} (ref >59)
Glucose: 100 mg/dL — ABNORMAL HIGH (ref 65–99)
Potassium: 4.9 mmol/L (ref 3.5–5.2)
Sodium: 140 mmol/L (ref 134–144)

## 2017-07-22 LAB — HEPATIC FUNCTION PANEL
ALK PHOS: 77 IU/L (ref 39–117)
ALT: 4 IU/L (ref 0–44)
AST: 21 IU/L (ref 0–40)
Albumin: 4.1 g/dL (ref 3.6–4.8)
Bilirubin Total: 0.5 mg/dL (ref 0.0–1.2)
Bilirubin, Direct: 0.14 mg/dL (ref 0.00–0.40)
TOTAL PROTEIN: 6.4 g/dL (ref 6.0–8.5)

## 2017-07-22 LAB — LIPID PANEL
CHOL/HDL RATIO: 3.3 ratio (ref 0.0–5.0)
Cholesterol, Total: 189 mg/dL (ref 100–199)
HDL: 57 mg/dL (ref >39)
LDL Calculated: 118 mg/dL — ABNORMAL HIGH (ref 0–99)
Triglycerides: 68 mg/dL (ref 0–149)
VLDL CHOLESTEROL CAL: 14 mg/dL (ref 5–40)

## 2017-07-22 LAB — VITAMIN D 25 HYDROXY (VIT D DEFICIENCY, FRACTURES): Vit D, 25-Hydroxy: 40.5 ng/mL (ref 30.0–100.0)

## 2017-07-24 ENCOUNTER — Telehealth: Payer: Self-pay | Admitting: Family Medicine

## 2017-07-24 NOTE — Addendum Note (Signed)
Addended by: Thana Ates on: 07/24/2017 11:08 AM   Modules accepted: Orders

## 2017-07-24 NOTE — Telephone Encounter (Signed)
Patient aware of results.

## 2017-08-04 ENCOUNTER — Other Ambulatory Visit: Payer: Self-pay | Admitting: Neurology

## 2017-09-15 ENCOUNTER — Encounter: Payer: Self-pay | Admitting: Family Medicine

## 2017-11-01 ENCOUNTER — Other Ambulatory Visit: Payer: Self-pay | Admitting: Neurology

## 2017-11-05 ENCOUNTER — Other Ambulatory Visit: Payer: Self-pay | Admitting: *Deleted

## 2017-11-05 ENCOUNTER — Telehealth: Payer: Self-pay | Admitting: Family Medicine

## 2017-11-05 ENCOUNTER — Other Ambulatory Visit: Payer: 59

## 2017-11-05 DIAGNOSIS — Z113 Encounter for screening for infections with a predominantly sexual mode of transmission: Secondary | ICD-10-CM

## 2017-11-05 DIAGNOSIS — E7800 Pure hypercholesterolemia, unspecified: Secondary | ICD-10-CM

## 2017-11-05 NOTE — Telephone Encounter (Signed)
Pt wants to come in for some labs - ordered

## 2017-11-06 LAB — LIPID PANEL
CHOLESTEROL TOTAL: 174 mg/dL (ref 100–199)
Chol/HDL Ratio: 3.1 ratio (ref 0.0–5.0)
HDL: 56 mg/dL (ref >39)
LDL CALC: 103 mg/dL — AB (ref 0–99)
Triglycerides: 77 mg/dL (ref 0–149)
VLDL CHOLESTEROL CAL: 15 mg/dL (ref 5–40)

## 2017-11-06 LAB — CHLAMYDIA/GONOCOCCUS/TRICHOMONAS, NAA
Chlamydia by NAA: NEGATIVE
Gonococcus by NAA: NEGATIVE
TRICH VAG BY NAA: NEGATIVE

## 2017-11-06 LAB — STD SCREEN (8)
HEP A IGM: NEGATIVE
HIV SCREEN 4TH GENERATION: NONREACTIVE
HSV 1 Glycoprotein G Ab, IgG: 44.8 index — ABNORMAL HIGH (ref 0.00–0.90)
HSV 2 IGG, TYPE SPEC: 2.1 {index} — AB (ref 0.00–0.90)
Hep B C IgM: NEGATIVE
Hepatitis B Surface Ag: NEGATIVE
RPR: NONREACTIVE

## 2017-11-06 LAB — HSV-2 IGG SUPPLEMENTAL TEST: HSV-2 IgG Supplemental Test: POSITIVE — AB

## 2017-11-07 ENCOUNTER — Telehealth: Payer: Self-pay | Admitting: Family Medicine

## 2017-11-07 ENCOUNTER — Telehealth: Payer: Self-pay

## 2017-11-07 NOTE — Telephone Encounter (Signed)
Patient called wanting to know lab results - labs reviwed with patient.,

## 2017-11-12 NOTE — Progress Notes (Signed)
Aaron Robertson was seen today in the movement disorders clinic for neurologic consultation at the request of Chipper Herb, MD.  The consultation is for the evaluation of tremor, voice changes and gait change.   Sx's have been going on for 6 months.  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  He states that he would concentrate on it and it would go away.  It actually has been better over the last few weeks/months.  In fact, he states that most of the above sx's are better than they were a few months ago.  02/16/15 update:  The patient presents today for follow-up.  He was diagnosed with Parkinson's disease last visit, but refuses all medication for it, as he unfortunately believes that starting medication would speed up the degenerative process, even though no literature supports this.  He also states today that he just really doesn't feel he needs it.  He cannot tell that he is rigid and generally feels well most days.  Pt brings in an "extra" video clip that lasted a full 6 minutes that he asked me to look at with him regarding the "protandim" that he is now taking.  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  Occasionally rides on the bike.  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  Thinks that there may be a buyout this summer at work and thinks that he may take that if it is an option.  States that tremor is better.    06/29/15 update:  The patient returns today for follow-up.  He has a history of Parkinson's disease and has been fairly rigid, but refuses medications.  He denies hallucinations.  He denies falls.  He denies any near syncopal episodes since our last visit.  I did review records since last visit.  He underwent a right inguinal hernia repair on May 15, 2015.  States that he didn't go back to  work.  He is noting more tremor especially when the hand is at rest and worries about that at work, as he is working with chemicals.  He is noting a loss of voice.    11/20/15 update:  The patient returns today for follow-up.  He is on no medication for Parkinson's disease, as he has refused medication thus far.  He refused LSVT loud at our last visit as well.  I have filled out disability papers on him as he felt that he was no longer able to work.  He is frustrated that his Social Security disability got denied; states that he was examined by a SS IM doctor and a ENT and it was thought that it would go through.  He was previously working with chemicals and felt it unsafe to do so.  Notes that he has R hand tremor when laying in bed.  Reading about supplements and marijuana plants.  When asked why he doesn't want to consider traditional meds, he says that he hasn't read about those (only reads about SE of them).  Asks me about those today.  Having more trouble with buttoning buttons, pulling pants up.    02/20/16 update:  Pt returns for f/u re: PD.  The patient was started on pramipexole last visit and work to 0.5 mg 3 times  a day.  The patient reports good benefit with the medication.  He states that "it is a miracle drug."    He denies compulsive behaviors.  Denies sleep attacks.  Will have mild tremor if nervous or walking.  He can button clothing now and "pull my pants up like I am normal."  Admits that his wife moved out and they are in counseling so that has been a bit stressful.  States that he is losing weight because of that but states that he is eating.  States that he got down to 135 but has gained some weight back.   He was referred for physical therapy since last visit but refused the service.  He wanted speech therapy, but then refused the surface because of financial constraints.  He denies any falls but sometimes drags the feet.  Denies lightheadedness or near syncope.  Denies hallucinations.  Is  doing push ups but hasnt started biking yet.    05/27/16 update:  The patient returns today for follow-up, somewhat earlier than expected.  He remains on pramipexole 0.5 mg 3 times per day.  He had a physical exam since our last visit and I reviewed that data.  He states that his daughter is going to be getting married soon (06/15/16) and he is planning on walking her down the aisle and just wants to avoid any tremor during that situation.  He asks about potentially increasing his medication.  He denies any hallucinations.  He denies compulsive behavior.  He denies lightheadedness or near-syncope.  He continues to exercise.  Reports that he is still under stress regarding fact that wife left him.  Has signed separation agreement.  He did see counselor.   Has been turned down two times for SS disability.  Is still appealing that.   He asks me about refilling his valtrex.  09/30/16 update:  The patient follows up today.  Last visit, I recommended that he increase pramipexole 0.5 mg so that he is taking 2 tablets in the morning, one in the afternoon and 1 in evening but he didn't do that and he is taking pramipexole 0.5 mg tid.    We did talk about taking levodopa just for his daughter's wedding so that he did not have so much tremor and he tells me that he started taking that 0.5 mg tid.  He thinks that it is working well.  Pt denies falls.  Pt denies lightheadedness, near syncope.  No hallucinations.  Mood has been better as he is now dating someone new.  He is doing lots of core exercises but not as much CV exercises.  States that "attitude is everything."  Still riding horses.    04/01/17 update:  Patient seen today in follow-up.  The patient is on pramipexole, 0.5 mg 3 times per day.  The patient denies sleep attacks.  He denies compulsive behaviors.  No cognitive changes.  He denies any falls.  He remains on carbidopa/levodopa 25/100, one tablet 3 times per day.  No hallucinations.  His divorce was final  yesterday.  He asks me to fill out his long term disability form.  He still drools and drags feet but he feels great.  He states that he is exercising and is in the best shape of his life.  He asks how long he can expect this to continue.  Will rarely shuffle if he doesn't "think about it."  Noting some sleep fragmentation.  07/14/17 update:  Patient in today in follow-up for  Parkinson's disease.  Patient is on pramipexole 0.5 mg 3 times a day and carbidopa/levodopa 25/100 tid.  "I wish I knew how to pick up my feet without thinking about it."  "It worries me."  No hallucinations.  No compulsive behaviors.  No sleep attacks.  No cognitive change.  Occasionally will note tremor in the right hand.  He denies falls.  He denies lightheadedness or near syncope.   He does push ups "fast and hard" but no other CV exercise.  Does better with stress now that divorce is over.  States that he was dx with asbestosis since last visit.    11/14/17 update: Patient seen in follow-up for Parkinson's disease he is on pramipexole 0.5 mg, 1 tablet 3 times per day and carbidopa/levodopa 25/100, 1 tablet 3 times per day (taking last at bedtime).  He notes a little tremor in the RUE with ambulation.  No compulsive behaviors.  No sleep attacks.  No hallucinations.  No near syncope.  No falls.  He is exercising, mostly with weightbearing exercises.  He does lots of push ups.  "I have no issues with balance."  He does not do cardiovascular related exercises.  Drags heels some with walking and runs better than walking.  The records that were made available to me were reviewed.  States that he was dx with STD (records indicate HSV) and he is taking lots of vitamins and supplements on the internet that promises to clear up "all STDs."  Neuroimaging has not previously been performed.   PREVIOUS MEDICATIONS: none to date  ALLERGIES:  No Known Allergies  CURRENT MEDICATIONS:  Outpatient Encounter Medications as of 11/14/2017  Medication  Sig  . carbidopa-levodopa (SINEMET IR) 25-100 MG tablet TAKE 1 TABLET THREE TIMES A DAY  . cholecalciferol (VITAMIN D) 1000 units tablet Take 1,000 Units by mouth daily.  Marland Kitchen MAGNESIUM CITRATE PO Take by mouth.  . pramipexole (MIRAPEX) 0.5 MG tablet Take 1 tablet (0.5 mg total) by mouth 3 (three) times daily.  . pramipexole (MIRAPEX) 0.5 MG tablet TAKE 1 TABLET (0.5 MG TOTAL) BY MOUTH 3 (THREE) TIMES DAILY.   No facility-administered encounter medications on file as of 11/14/2017.     PAST MEDICAL HISTORY:   Past Medical History:  Diagnosis Date  . Asbestosis ([place])   . Inguinal hernia 04/2015   right  . Neuromuscular disorder ([place])    Parkinson's disease  . Parkinson's disease ([place])     PAST SURGICAL HISTORY:   Past Surgical History:  Procedure Laterality Date  . COLONOSCOPY    . INGUINAL HERNIA REPAIR Right 05/15/2015   Procedure: OPEN RIGHT INGUINAL HERNIA REPAIR ;  Surgeon: Fanny Skates, MD;  Location: [facility];  Service: General;  Laterality: Right;  . INSERTION OF MESH Right 05/15/2015   Procedure: INSERTION OF MESH;  Surgeon: Fanny Skates, MD;  Location: [facility];  Service: General;  Laterality: Right;    SOCIAL HISTORY:   Social History   Socioeconomic History  . Marital status: Married    Spouse name: Not on file  . Number of children: Not on file  . Years of education: Not on file  . Highest education level: Not on file  Social Needs  . Financial resource strain: Not on file  . Food insecurity - worry: Not on file  . Food insecurity - inability: Not on file  . Transportation needs - medical: Not on file  . Transportation needs - non-medical: Not on file  Occupational  History  . Not on file  Tobacco Use  . Smoking status: Never Smoker  . Smokeless tobacco: Never Used  Substance and Sexual Activity  . Alcohol use: Yes    Alcohol/week: 0.0 oz    Comment: occasionally  . Drug use: No  . Sexual activity: Not on file  Other  Topics Concern  . Not on file  Social History Narrative  . Not on file    FAMILY HISTORY:   Family Status  Relation Name Status  . Mother  Alive       heart disease  . Mat Uncle  Deceased  . Father  Deceased       alzheimer's   . PGM  Deceased       ALS  . Sister  Alive       hyperlipidemia  . Sister  Alive       hyperlipidemia  . Son  Alive       healthy  . Daughter  Alive       healthy  . PGF  (Not Specified)    ROS:  A complete 10 system review of systems was obtained and was unremarkable apart from what is mentioned above.  PHYSICAL EXAMINATION:    VITALS:   There were no vitals filed for this visit. Wt Readings from Last 3 Encounters:  07/14/17 164 lb (74.4 kg)  07/04/17 165 lb (74.8 kg)  04/01/17 162 lb (73.5 kg)     GEN:  The patient appears stated age and is in NAD. HEENT:  Normocephalic, atraumatic.  The mucous membranes are moist. The superficial temporal arteries are without ropiness or tenderness. CV:  RRR Lungs:  CTAB Neck/HEME:  There are no carotid bruits bilaterally.  Neurological examination:  Orientation: The patient is alert and oriented x3.  Cranial nerves: There is good facial symmetry. There is significant facial hypomimia.   The visual fields are full to confrontational testing. The speech is fluent and clear. There is hypophonic speech.  Soft palate rises symmetrically and there is no tongue deviation. Hearing is intact to conversational tone. Sensation: Sensation is intact to light touch throughout. Motor: Strength is 5/5 in the UE/LE   Movement examination: Tone: There is no rigidity in the UE/LE Abnormal movements: There is minimal tremor in the RUE  Coordination:  There is no decremation with any form of RAMS, including alternating supination and pronation of the forearm, hand opening and closing, finger taps, heel taps and toe taps. Gait and Station: The patient has no trouble without the use of the hands.  He ambulates well.   Negative pull test.    LABS:    Chemistry      Component Value Date/Time   NA 140 07/21/2017 0804   K 4.9 07/21/2017 0804   CL 100 07/21/2017 0804   CO2 27 07/21/2017 0804   BUN 18 07/21/2017 0804   CREATININE 1.00 07/21/2017 0804      Component Value Date/Time   CALCIUM 9.3 07/21/2017 0804   ALKPHOS 77 07/21/2017 0804   AST 21 07/21/2017 0804   ALT 4 07/21/2017 0804   BILITOT 0.5 07/21/2017 0804     Lab Results  Component Value Date   TSH 3.190 04/02/2016   Lab Results  Component Value Date   WBC 3.6 07/21/2017   HGB 15.0 07/21/2017   HCT 43.8 07/21/2017   MCV 91 07/21/2017   PLT 170 07/21/2017      ASSESSMENT/PLAN:  1.  Idiopathic Parkinson's disease, diagnosed in  December, 2015  -continue pramipexole 0.5 mg, 1 in the AM, 1 in the afternoon, 1 in the PM.   Risks, benefits, side effects and alternative therapies were discussed including but not limited to sleep attacks and compulsive behaviors.  The opportunity to ask questions was given and they were answered to the best of my ability.  The patient expressed understanding and willingness to follow the outlined treatment protocols.  -continue carbidopa/levodopa 25/100 tid.  Talked to him about moving dosages closer together and not taking last at qhs.  Spreading dosages too far (currently 6am/2pm/bedtime and want to change to 6am/11am/4pm)  -talked about CV exercise 2.  HSV  -long talk with the patient.  He is getting "taken" by an internet scam that promises cure through taking vitamins.  I talked to him about these falsehoods but I didn't convince him.  Told him to talk with PCP as well 3.  Follow up is anticipated in the next 6 months, sooner should new neurologic issues arise.  Much greater than 50% of this visit was spent in counseling and coordinating care.  Total face to face time:  25 min

## 2017-11-14 ENCOUNTER — Ambulatory Visit (INDEPENDENT_AMBULATORY_CARE_PROVIDER_SITE_OTHER): Payer: 59 | Admitting: Neurology

## 2017-11-14 ENCOUNTER — Encounter: Payer: Self-pay | Admitting: Neurology

## 2017-11-14 VITALS — BP 130/68 | HR 66 | Ht 68.0 in | Wt 165.0 lb

## 2017-11-14 DIAGNOSIS — G20 Parkinson's disease: Secondary | ICD-10-CM

## 2017-11-14 DIAGNOSIS — B009 Herpesviral infection, unspecified: Secondary | ICD-10-CM

## 2017-11-14 NOTE — Telephone Encounter (Signed)
-----   Message from Chipper Herb, MD sent at 11/07/2017  7:15 AM EST ----- Chlamydia gonococcus and trichomonas were all negative. Please call the patient with this result as well as with results with recommendations that have already been reviewed.  In summary the only positive tests were herpes simplex virus type I and II.  All other tests were basically negative.

## 2017-11-14 NOTE — Telephone Encounter (Signed)
Pt aware of results.  Verbalizes understanding

## 2017-12-11 IMAGING — DX DG CHEST 2V
2 series · 2 of 2 positions shown · non-contrast
Comparison: PA and lateral chest x-ray August 22, 2014

CLINICAL DATA: Annual physical examination; history of
hyperlipidemia, Parkinson's disease.

EXAM:
CHEST  2 VIEW

[chest pa]
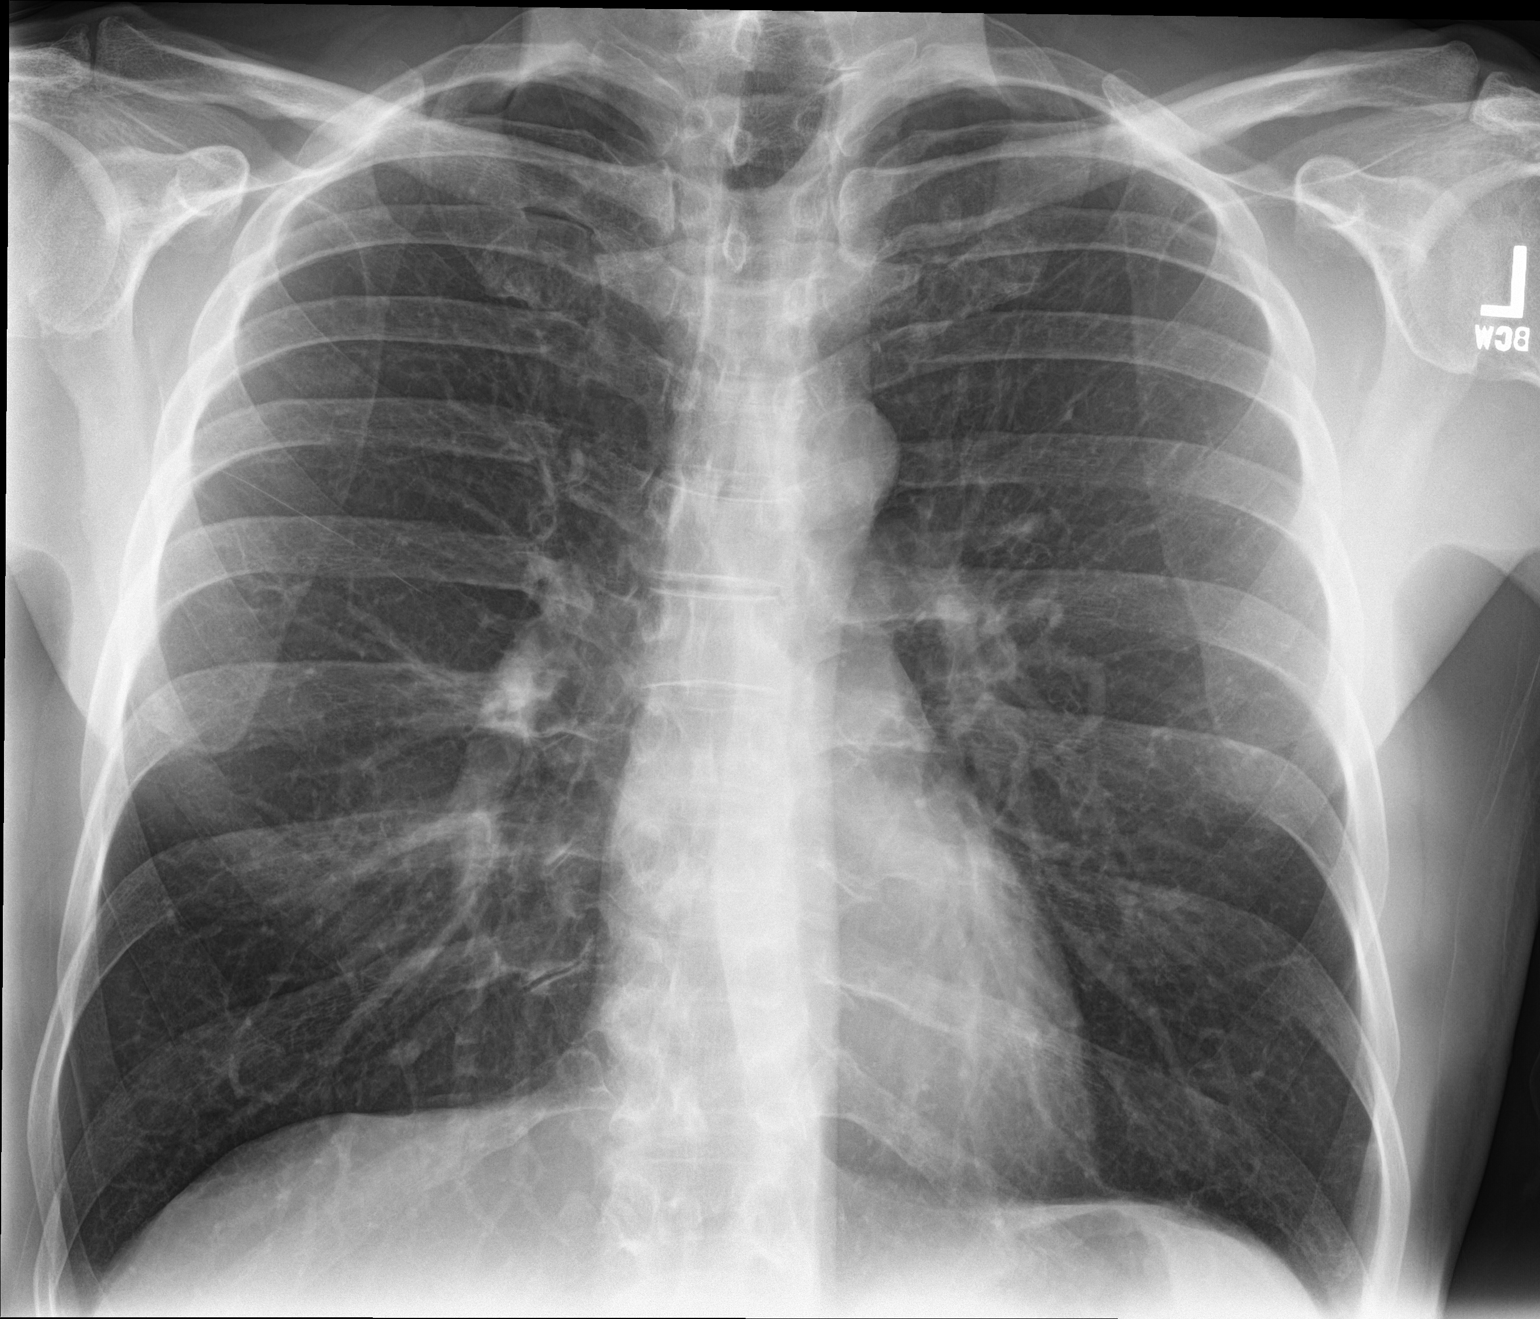

[chest lat]
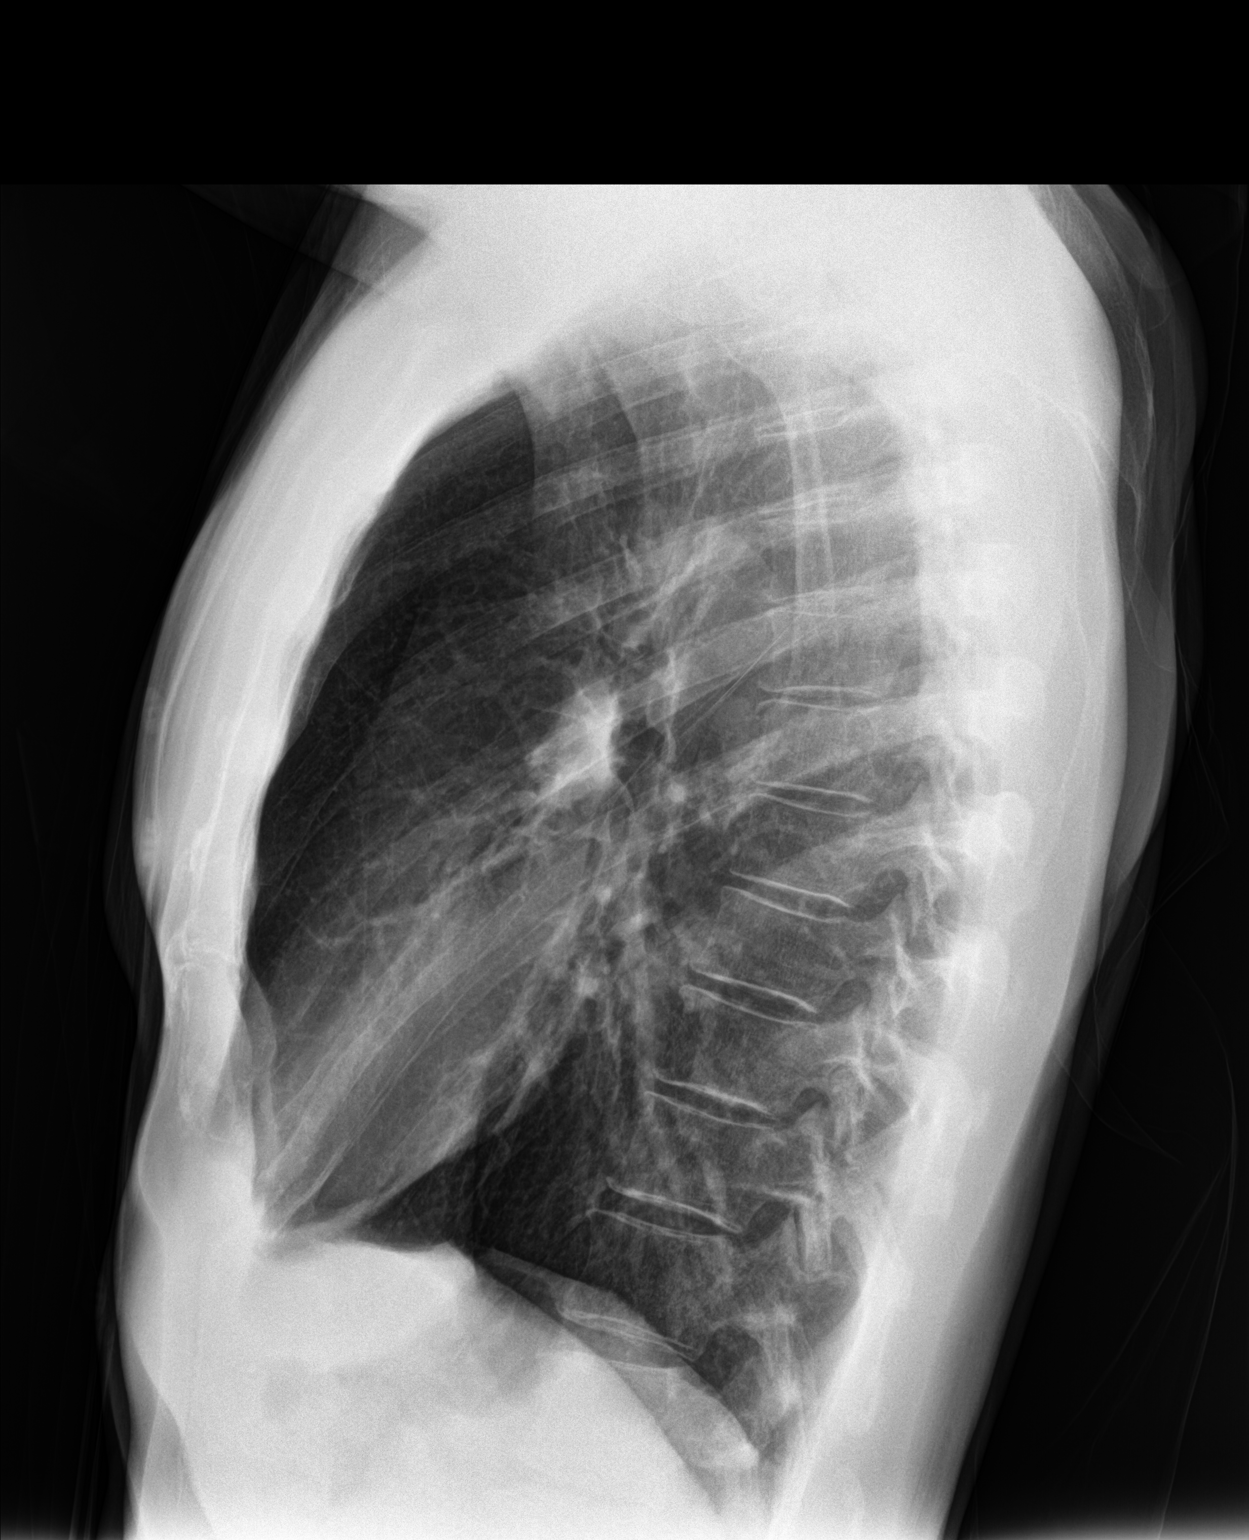

[2 of 2 positions shown; findings below may reference images not displayed]

FINDINGS: The lungs are mildly hyperinflated with hemidiaphragm flattening and
increased AP dimension of the thorax. There is no alveolar
infiltrate or pulmonary mass. The heart and pulmonary vascularity
are normal. The mediastinum is normal in width. There is no pleural
effusion. The trachea is midline. The bony thorax is unremarkable.
IMPRESSION: Hyperinflation consistent with reactive airway disease or COPD,
stable. There is no acute cardiopulmonary abnormality.

## 2017-12-17 ENCOUNTER — Telehealth: Payer: Self-pay | Admitting: Family Medicine

## 2017-12-17 ENCOUNTER — Other Ambulatory Visit: Payer: Self-pay | Admitting: *Deleted

## 2017-12-17 ENCOUNTER — Other Ambulatory Visit: Payer: 59

## 2017-12-17 DIAGNOSIS — Z20828 Contact with and (suspected) exposure to other viral communicable diseases: Secondary | ICD-10-CM

## 2017-12-17 NOTE — Telephone Encounter (Signed)
Lab order placed.

## 2017-12-19 LAB — HSV-2 IGG SUPPLEMENTAL TEST: HSV-2 IgG Supplemental Test: POSITIVE — AB

## 2017-12-19 LAB — HSV(HERPES SMPLX)ABS-I+II(IGG+IGM)-BLD
HSV 1 GLYCOPROTEIN G AB, IGG: 54.6 {index} — AB (ref 0.00–0.90)
HSV 2 IgG, Type Spec: 1.68 index — ABNORMAL HIGH (ref 0.00–0.90)
HSVI/II Comb IgM: 1.23 Ratio — ABNORMAL HIGH (ref 0.00–0.90)

## 2018-01-26 ENCOUNTER — Other Ambulatory Visit: Payer: Self-pay | Admitting: Neurology

## 2018-05-04 NOTE — Progress Notes (Signed)
Aaron Robertson was seen today in the movement disorders clinic for neurologic consultation at the request of Chipper Herb, MD.  The consultation is for the evaluation of tremor, voice changes and gait change.   Sx's have been going on for 6 months.  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  He states that he would concentrate on it and it would go away.  It actually has been better over the last few weeks/months.  In fact, he states that most of the above sx's are better than they were a few months ago.  02/16/15 update:  The patient presents today for follow-up.  He was diagnosed with Parkinson's disease last visit, but refuses all medication for it, as he unfortunately believes that starting medication would speed up the degenerative process, even though no literature supports this.  He also states today that he just really doesn't feel he needs it.  He cannot tell that he is rigid and generally feels well most days.  Pt brings in an "extra" video clip that lasted a full 6 minutes that he asked me to look at with him regarding the "protandim" that he is now taking.  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  Occasionally rides on the bike.  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  Thinks that there may be a buyout this summer at work and thinks that he may take that if it is an option.  States that tremor is better.    06/29/15 update:  The patient returns today for follow-up.  He has a history of Parkinson's disease and has been fairly rigid, but refuses medications.  He denies hallucinations.  He denies falls.  He denies any near syncopal episodes since our last visit.  I did review records since last visit.  He underwent a right inguinal hernia repair on May 15, 2015.  States that he didn't go back to  work.  He is noting more tremor especially when the hand is at rest and worries about that at work, as he is working with chemicals.  He is noting a loss of voice.    11/20/15 update:  The patient returns today for follow-up.  He is on no medication for Parkinson's disease, as he has refused medication thus far.  He refused LSVT loud at our last visit as well.  I have filled out disability papers on him as he felt that he was no longer able to work.  He is frustrated that his Social Security disability got denied; states that he was examined by a SS IM doctor and a ENT and it was thought that it would go through.  He was previously working with chemicals and felt it unsafe to do so.  Notes that he has R hand tremor when laying in bed.  Reading about supplements and marijuana plants.  When asked why he doesn't want to consider traditional meds, he says that he hasn't read about those (only reads about SE of them).  Asks me about those today.  Having more trouble with buttoning buttons, pulling pants up.    02/20/16 update:  Pt returns for f/u re: PD.  The patient was started on pramipexole last visit and work to 0.5 mg 3 times  a day.  The patient reports good benefit with the medication.  He states that "it is a miracle drug."    He denies compulsive behaviors.  Denies sleep attacks.  Will have mild tremor if nervous or walking.  He can button clothing now and "pull my pants up like I am normal."  Admits that his wife moved out and they are in counseling so that has been a bit stressful.  States that he is losing weight because of that but states that he is eating.  States that he got down to 135 but has gained some weight back.   He was referred for physical therapy since last visit but refused the service.  He wanted speech therapy, but then refused the surface because of financial constraints.  He denies any falls but sometimes drags the feet.  Denies lightheadedness or near syncope.  Denies hallucinations.  Is  doing push ups but hasnt started biking yet.    05/27/16 update:  The patient returns today for follow-up, somewhat earlier than expected.  He remains on pramipexole 0.5 mg 3 times per day.  He had a physical exam since our last visit and I reviewed that data.  He states that his daughter is going to be getting married soon (06/15/16) and he is planning on walking her down the aisle and just wants to avoid any tremor during that situation.  He asks about potentially increasing his medication.  He denies any hallucinations.  He denies compulsive behavior.  He denies lightheadedness or near-syncope.  He continues to exercise.  Reports that he is still under stress regarding fact that wife left him.  Has signed separation agreement.  He did see counselor.   Has been turned down two times for SS disability.  Is still appealing that.   He asks me about refilling his valtrex.  09/30/16 update:  The patient follows up today.  Last visit, I recommended that he increase pramipexole 0.5 mg so that he is taking 2 tablets in the morning, one in the afternoon and 1 in evening but he didn't do that and he is taking pramipexole 0.5 mg tid.    We did talk about taking levodopa just for his daughter's wedding so that he did not have so much tremor and he tells me that he started taking that 0.5 mg tid.  He thinks that it is working well.  Pt denies falls.  Pt denies lightheadedness, near syncope.  No hallucinations.  Mood has been better as he is now dating someone new.  He is doing lots of core exercises but not as much CV exercises.  States that "attitude is everything."  Still riding horses.    04/01/17 update:  Patient seen today in follow-up.  The patient is on pramipexole, 0.5 mg 3 times per day.  The patient denies sleep attacks.  He denies compulsive behaviors.  No cognitive changes.  He denies any falls.  He remains on carbidopa/levodopa 25/100, one tablet 3 times per day.  No hallucinations.  His divorce was final  yesterday.  He asks me to fill out his long term disability form.  He still drools and drags feet but he feels great.  He states that he is exercising and is in the best shape of his life.  He asks how long he can expect this to continue.  Will rarely shuffle if he doesn't "think about it."  Noting some sleep fragmentation.  07/14/17 update:  Patient in today in follow-up for  Parkinson's disease.  Patient is on pramipexole 0.5 mg 3 times a day and carbidopa/levodopa 25/100 tid.  "I wish I knew how to pick up my feet without thinking about it."  "It worries me."  No hallucinations.  No compulsive behaviors.  No sleep attacks.  No cognitive change.  Occasionally will note tremor in the right hand.  He denies falls.  He denies lightheadedness or near syncope.   He does push ups "fast and hard" but no other CV exercise.  Does better with stress now that divorce is over.  States that he was dx with asbestosis since last visit.    11/14/17 update: Patient seen in follow-up for Parkinson's disease he is on pramipexole 0.5 mg, 1 tablet 3 times per day and carbidopa/levodopa 25/100, 1 tablet 3 times per day (taking last at bedtime).  He notes a little tremor in the RUE with ambulation.  No compulsive behaviors.  No sleep attacks.  No hallucinations.  No near syncope.  No falls.  He is exercising, mostly with weightbearing exercises.  He does lots of push ups.  "I have no issues with balance."  He does not do cardiovascular related exercises.  Drags heels some with walking and runs better than walking.  The records that were made available to me were reviewed.  States that he was dx with STD (records indicate HSV) and he is taking lots of vitamins and supplements on the internet that promises to clear up "all STDs."  05/18/18 update: Patient is seen today in follow-up for Parkinson's disease.  The patient is on pramipexole 0.5 mg, 1 tablet 3 times per day and carbidopa/levodopa 25/100, 1 tablet 3 times per day (often times  will forget middle of the day dose and end up with the last one at bedtime).  No compulsive behaviors.  No sleep attacks.  No hallucinations.  No falls.  Notes some dragging of the feet intermittently, especially on gravel/unsteady surfaces.  Taking CBD oil.  Not exercising like he was.  Is planning on getting married in the fall.  Neuroimaging has not previously been performed.   PREVIOUS MEDICATIONS: none to date  ALLERGIES:  No Known Allergies  CURRENT MEDICATIONS:  Outpatient Encounter Medications as of 05/18/2018  Medication Sig  . carbidopa-levodopa (SINEMET IR) 25-100 MG tablet TAKE 1 TABLET BY MOUTH THREE TIMES A DAY  . MAGNESIUM CITRATE PO Take by mouth.  . Misc Natural Products (T-RELIEF CBD+13 SL) Place under the tongue. CBD oil PRN  . pramipexole (MIRAPEX) 0.5 MG tablet Take 1 tablet (0.5 mg total) by mouth 3 (three) times daily.  . [DISCONTINUED] cholecalciferol (VITAMIN D) 1000 units tablet Take 1,000 Units by mouth daily.   No facility-administered encounter medications on file as of 05/18/2018.     PAST MEDICAL HISTORY:   Past Medical History:  Diagnosis Date  . Asbestosis ([place])   . Inguinal hernia 04/2015   right  . Neuromuscular disorder ([place])    Parkinson's disease  . Parkinson's disease ([address])     PAST SURGICAL HISTORY:   Past Surgical History:  Procedure Laterality Date  . COLONOSCOPY    . INGUINAL HERNIA REPAIR Right 05/15/2015   Procedure: OPEN RIGHT INGUINAL HERNIA REPAIR ;  Surgeon: Fanny Skates, MD;  Location: [facility] [facility];  Service: General;  Laterality: Right;  . INSERTION OF MESH Right 05/15/2015   Procedure: INSERTION OF MESH;  Surgeon: Fanny Skates, MD;  Location: [place];  Service: General;  Laterality: Right;  SOCIAL HISTORY:   Social History   Socioeconomic History  . Marital status: Married    Spouse name: Not on file  . Number of children: Not on file  . Years of education: Not on file  . Highest education  level: Not on file  Occupational History  . Not on file  Social Needs  . Financial resource strain: Not on file  . Food insecurity:    Worry: Not on file    Inability: Not on file  . Transportation needs:    Medical: Not on file    Non-medical: Not on file  Tobacco Use  . Smoking status: Never Smoker  . Smokeless tobacco: Never Used  Substance and Sexual Activity  . Alcohol use: Yes    Alcohol/week: 0.0 oz    Comment: occasionally  . Drug use: No  . Sexual activity: Not on file  Lifestyle  . Physical activity:    Days per week: Not on file    Minutes per session: Not on file  . Stress: Not on file  Relationships  . Social connections:    Talks on phone: Not on file    Gets together: Not on file    Attends religious service: Not on file    Active member of club or organization: Not on file    Attends meetings of clubs or organizations: Not on file    Relationship status: Not on file  . Intimate partner violence:    Fear of current or ex partner: Not on file    Emotionally abused: Not on file    Physically abused: Not on file    Forced sexual activity: Not on file  Other Topics Concern  . Not on file  Social History Narrative  . Not on file    FAMILY HISTORY:   Family Status  Relation Name Status  . Mother  Alive       heart disease  . Mat Uncle  Deceased  . Father  Deceased       alzheimer's   . PGM  Deceased       ALS  . Sister  Alive       hyperlipidemia  . Sister  Alive       hyperlipidemia  . Son  Alive       healthy  . Daughter  Alive       healthy  . PGF  (Not Specified)    ROS:  Review of Systems  Constitutional: Negative.   HENT: Negative.   Eyes: Negative.   Respiratory: Negative.   Cardiovascular: Negative.   Genitourinary: Negative.   Skin: Negative.   Neurological: Negative.     PHYSICAL EXAMINATION:    VITALS:   Vitals:   05/18/18 0807  BP: 100/70  Pulse: 60  SpO2: 98%  Weight: 161 lb (73 kg)  Height: 5\' 8"  (1.727 m)    Wt Readings from Last 3 Encounters:  05/18/18 161 lb (73 kg)  11/14/17 165 lb (74.8 kg)  07/14/17 164 lb (74.4 kg)    GEN:  The patient appears stated age and is in NAD. HEENT:  Normocephalic, atraumatic.  The mucous membranes are moist. The superficial temporal arteries are without ropiness or tenderness. CV:  RRR Lungs:  CTAB Neck/HEME:  There are no carotid bruits bilaterally.  Neurological examination:  Orientation: The patient is alert and oriented x3. Cranial nerves: There is good facial symmetry.  There is facial hypomimia.  The speech is fluent and clear. Soft palate  rises symmetrically and there is no tongue deviation. Hearing is intact to conversational tone. Sensation: Sensation is intact to light touch throughout Motor: Strength is 5/5 in the bilateral upper and lower extremities.   Shoulder shrug is equal and symmetric.  There is no pronator drift.  Movement examination: Tone: There is no rigidity in the UE/LE Abnormal movements: There is rare rest tremor in the RUE Coordination:  There is no decremation, with any form of RAMS, including alternating supination and pronation of the forearm, hand opening and closing, finger taps, heel taps and toe taps. Gait and Station: The patient has no trouble without the use of the hands.  Walks well.  Negative pull test  LABS:    Chemistry      Component Value Date/Time   NA 140 07/21/2017 0804   K 4.9 07/21/2017 0804   CL 100 07/21/2017 0804   CO2 27 07/21/2017 0804   BUN 18 07/21/2017 0804   CREATININE 1.00 07/21/2017 0804      Component Value Date/Time   CALCIUM 9.3 07/21/2017 0804   ALKPHOS 77 07/21/2017 0804   AST 21 07/21/2017 0804   ALT 4 07/21/2017 0804   BILITOT 0.5 07/21/2017 0804     Lab Results  Component Value Date   TSH 3.190 04/02/2016   Lab Results  Component Value Date   WBC 3.6 07/21/2017   HGB 15.0 07/21/2017   HCT 43.8 07/21/2017   MCV 91 07/21/2017   PLT 170 07/21/2017       ASSESSMENT/PLAN:  1.  Idiopathic Parkinson's disease, diagnosed in December, 2015  -continue pramipexole 0.5 mg, 1 in the AM, 1 in the afternoon, 1 in the PM.   Risks, benefits, side effects and alternative therapies were discussed including but not limited to sleep attacks and compulsive behaviors.  The opportunity to ask questions was given and they were answered to the best of my ability.  The patient expressed understanding and willingness to follow the outlined treatment protocols.  -continue carbidopa/levodopa 25/100 tid.  Talked to him again about moving dosages closer together  -add back in exercise  -The patient asked me about CBD oil.  Discussed literature on that as it relates to Parkinson's disease.  Not recommended by [facility] because of lack of controlled trials.  Talked about smaller trials in which marijuana helped tremor.  However, there is some data that suggests that it worsens cognition and falls.  The data also suggests that CBD oil is less effective than marijuana.  However, there have been concerns given the fact that CBD oil is unregulated and each manufacturer has different amounts of ingredient and the purity of each manufacturers ingredient has been called into question.  At this time, it is not recommended for the treatment of Parkinson's disease.  Further studies do need to be completed. 2.  F/u in 6 months.  Much greater than 50% of this visit was spent in counseling and coordinating care.  Total face to face time:  25 min.  He is planning on getting married in the fall.

## 2018-05-18 ENCOUNTER — Ambulatory Visit (INDEPENDENT_AMBULATORY_CARE_PROVIDER_SITE_OTHER): Payer: 59 | Admitting: Neurology

## 2018-05-18 ENCOUNTER — Encounter: Payer: Self-pay | Admitting: Neurology

## 2018-05-18 VITALS — BP 100/70 | HR 60 | Ht 68.0 in | Wt 161.0 lb

## 2018-05-18 DIAGNOSIS — G20 Parkinson's disease: Secondary | ICD-10-CM | POA: Diagnosis not present

## 2018-07-06 ENCOUNTER — Ambulatory Visit: Payer: 59 | Admitting: Family Medicine

## 2018-07-29 ENCOUNTER — Other Ambulatory Visit: Payer: Self-pay | Admitting: Neurology

## 2018-07-29 ENCOUNTER — Ambulatory Visit: Payer: 59 | Admitting: Family Medicine

## 2018-08-03 ENCOUNTER — Ambulatory Visit (INDEPENDENT_AMBULATORY_CARE_PROVIDER_SITE_OTHER): Payer: Medicare Other | Admitting: Family Medicine

## 2018-08-03 ENCOUNTER — Encounter: Payer: Self-pay | Admitting: Family Medicine

## 2018-08-03 VITALS — BP 89/57 | HR 67 | Temp 96.8°F | Ht 68.0 in | Wt 164.0 lb

## 2018-08-03 DIAGNOSIS — E559 Vitamin D deficiency, unspecified: Secondary | ICD-10-CM

## 2018-08-03 DIAGNOSIS — N400 Enlarged prostate without lower urinary tract symptoms: Secondary | ICD-10-CM

## 2018-08-03 DIAGNOSIS — E7800 Pure hypercholesterolemia, unspecified: Secondary | ICD-10-CM

## 2018-08-03 DIAGNOSIS — G20 Parkinson's disease: Secondary | ICD-10-CM

## 2018-08-03 DIAGNOSIS — Z0000 Encounter for general adult medical examination without abnormal findings: Secondary | ICD-10-CM

## 2018-08-03 DIAGNOSIS — L989 Disorder of the skin and subcutaneous tissue, unspecified: Secondary | ICD-10-CM

## 2018-08-03 DIAGNOSIS — J61 Pneumoconiosis due to asbestos and other mineral fibers: Secondary | ICD-10-CM

## 2018-08-03 LAB — URINALYSIS, COMPLETE
BILIRUBIN UA: NEGATIVE
Glucose, UA: NEGATIVE
LEUKOCYTES UA: NEGATIVE
NITRITE UA: NEGATIVE
Protein, UA: NEGATIVE
RBC UA: NEGATIVE
Specific Gravity, UA: 1.025 (ref 1.005–1.030)
Urobilinogen, Ur: 0.2 mg/dL (ref 0.2–1.0)
pH, UA: 5.5 (ref 5.0–7.5)

## 2018-08-03 LAB — MICROSCOPIC EXAMINATION
Bacteria, UA: NONE SEEN
Epithelial Cells (non renal): NONE SEEN /hpf (ref 0–10)
RBC MICROSCOPIC, UA: NONE SEEN /HPF (ref 0–2)
Renal Epithel, UA: NONE SEEN /hpf
WBC UA: NONE SEEN /HPF (ref 0–5)

## 2018-08-03 NOTE — Progress Notes (Signed)
Subjective:    Patient ID: Aaron Robertson, male    DOB: [birth year], 62 y.o.   MRN: [id number]  HPI Patient is here today for annual wellness exam and follow up of chronic medical problems which includes hyperlipidemia. He is taking medication regularly.  The patient is doing well overall.  He does take Sinemet 25 100 and Mirapex.  He sees Dr. Carles Collet, the neurologist for his Parkinson's follow-up.  He has a history of hyperlipidemia in addition to the Parkinson's diagnosis along with vitamin D deficiency.  He is concerned about a skin lesion on his right shoulder.  His weight is up 3 pounds since the previous visit and his BMI is 24.49.  The patient is pleasant and alert and does see the dermatologist regularly and the lesion on his right shoulder appears to be a seborrheic keratosis.  He sees Dr. Carles Collet, his neurologist every 6 months.  His last visit was in August of this year.  He does have a CT scan schedule of his chest because of asbestos exposure.  He will return to the office fasting for his lab work and he refuses to take a flu shot.  Is far as his Parkinson's is concerned he is having a little more motor pain especially in the right arm and some in his legs.  He denies any chest pain or shortness of breath.  He denies any trouble with swallowing heartburn indigestion nausea vomiting diarrhea or blood in the stool.  He does complain of more constipation but admits to drinking very little water every day.  He is passing his water well with no complaints with voiding.  He is past due on getting a repeat colonoscopy.  He had the first one done when he was 62 years old.  As far as he knows it was normal.  There is a family history of colon polyps in his father and his mother but no colon cancer.  We discussed the Cologuard with him.    Patient Active Problem List   Diagnosis Date Noted  . Vitamin D deficiency 04/02/2016  . Right inguinal hernia 05/15/2015  . Hyperlipidemia 09/29/2014  . Parkinson's  disease ([place]) 09/27/2014  . Other fatigue 08/22/2014  . Tremors of nervous system 08/22/2014  . Abnormality of gait 08/22/2014  . BPH (benign prostatic hyperplasia) 08/22/2014  . Movement disorder 08/22/2014   Outpatient Encounter Medications as of 08/03/2018  Medication Sig  . carbidopa-levodopa (SINEMET IR) 25-100 MG tablet TAKE 1 TABLET BY MOUTH THREE TIMES A DAY  . MAGNESIUM CITRATE PO Take by mouth.  . Misc Natural Products (T-RELIEF CBD+13 SL) Place under the tongue. CBD oil PRN  . pramipexole (MIRAPEX) 0.5 MG tablet TAKE 1 TABLET (0.5 MG TOTAL) BY MOUTH 3 (THREE) TIMES DAILY.  . [DISCONTINUED] pramipexole (MIRAPEX) 0.5 MG tablet Take 1 tablet (0.5 mg total) by mouth 3 (three) times daily.   No facility-administered encounter medications on file as of 08/03/2018.      Review of Systems  Constitutional: Negative.   HENT: Negative.   Eyes: Negative.   Respiratory: Negative.   Cardiovascular: Negative.   Gastrointestinal: Negative.   Endocrine: Negative.   Genitourinary: Negative.   Musculoskeletal: Negative.   Skin: Negative.        Skin lesion - right shoulder   Allergic/Immunologic: Negative.   Neurological: Negative.   Hematological: Negative.   Psychiatric/Behavioral: Negative.        Objective:   Physical Exam  Constitutional: He is oriented to person,  place, and time. He appears well-developed and well-nourished.  The patient is positive and pleasant and staying active and coping well with his Parkinson's diagnosis and sees the neurologist regularly.  HENT:  Head: Normocephalic and atraumatic.  Right Ear: External ear normal.  Left Ear: External ear normal.  Nose: Nose normal.  Mouth/Throat: Oropharynx is clear and moist. No oropharyngeal exudate.  Eyes: Pupils are equal, round, and reactive to light. Conjunctivae and EOM are normal. Right eye exhibits no discharge. Left eye exhibits no discharge. No scleral icterus.  Patient indicates he is having a few  visual problems and will schedule visit with ophthalmology.  Neck: Normal range of motion. Neck supple. No thyromegaly present.  No bruits thyromegaly or anterior cervical adenopathy  Cardiovascular: Normal rate, regular rhythm, normal heart sounds and intact distal pulses.  No murmur heard. Heart is regular at 72/min  Pulmonary/Chest: Effort normal and breath sounds normal. No respiratory distress. He has no wheezes. He has no rales. He exhibits no tenderness.  Clear anteriorly and posteriorly and no axillary adenopathy.  No chest wall masses or tenderness.  Abdominal: Soft. Bowel sounds are normal. He exhibits no mass. There is no tenderness. There is no guarding.  No liver or spleen enlargement.  No epigastric tenderness.  No masses no bruits and no inguinal adenopathy.  Status post right inguinal hernia repair.  Genitourinary: Rectum normal and penis normal.  Genitourinary Comments: The prostate was minimally enlarged without lumps or masses.  There were no rectal masses.  External genitalia were within normal limits and no inguinal hernias were palpated on either side.  Musculoskeletal: Normal range of motion. He exhibits no edema or tenderness.  Some soreness in the right arm which may be musculoskeletal in nature.  Lymphadenopathy:    He has no cervical adenopathy.  Neurological: He is alert and oriented to person, place, and time. He has normal reflexes. No cranial nerve deficit.  Patient has Parkinson's but no obvious tremor observed no significant stiffness observed.  Skin: Skin is warm and dry. No rash noted. No erythema. No pallor.  Seborrheic keratosis on right posterior shoulder.  Patient has dermatology appointment and follow-up.  Psychiatric: He has a normal mood and affect. His behavior is normal. Judgment and thought content normal.  The patient's mood affect and behavior were good and normal for him.  Nursing note and vitals reviewed.  BP (!) 89/57 (BP Location: Left Arm)    Pulse 67   Temp (!) 96.8 F (36 C) (Oral)   Ht 5' 8" (1.727 m)   Wt 164 lb (74.4 kg)   BMI 24.94 kg/m         Assessment & Plan:  1. Annual physical exam -Follow-up with dermatology as planned -Get chest CT as planned and make sure that we receive a copy of the report - Urinalysis, Complete - BMP8+EGFR; Future - CBC with Differential/Platelet; Future - Lipid panel; Future - VITAMIN D 25 Hydroxy (Vit-D Deficiency, Fractures); Future - Hepatic function panel; Future - PSA, total and free; Future  2. Pure hypercholesterolemia -Continue with aggressive therapeutic lifestyle changes  including diet and exercise - CBC with Differential/Platelet; Future - Lipid panel; Future - Hepatic function panel; Future  3. Vitamin D deficiency -Continue with vitamin D replacement pending results of lab work - CBC with Differential/Platelet; Future - VITAMIN D 25 Hydroxy (Vit-D Deficiency, Fractures); Future  4. Benign prostatic hyperplasia, unspecified whether lower urinary tract symptoms present -The prostate remains enlarged slightly but no complaints with voiding -  Urinalysis, Complete - CBC with Differential/Platelet; Future - PSA, total and free; Future  5. Primary Parkinsonism ([place]) -Follow-up with Dr. Carles Collet as planned - BMP8+EGFR; Future - CBC with Differential/Platelet; Future  6. Asbestosis ([place]) -Get CT scan as planned  7. Skin lesion of back -Reassure  Patient Instructions  Continue current medications. Continue good therapeutic lifestyle changes which include good diet and exercise. Fall precautions discussed with patient. If an FOBT was given today- please return it to our front desk. If you are over 36 years old - you may need Prevnar 33 or the adult Pneumonia vaccine.  **Flu shots are available--- please call and schedule a FLU-CLINIC appointment**  After your visit with [place] today you will receive a survey in the mail or online from [employer] regarding your  care with [place]. Please take a moment to fill this out. Your feedback is very important to [place] as you can help [place] better understand your patient needs as well as improve your experience and satisfaction. WE CARE ABOUT YOU!!!   Continue to follow-up with neurology Consider the Cologuard test for checking for colon cancer This winter drink plenty of fluids and stay well-hydrated Make sure that you get a copy of the CT scan of the lungs sent to [place] by the radiologist Drink plenty of fluids and stay well-hydrated Try MiraLAX for constipation in addition to drinking plenty of water Return the FOBT card  Arrie Senate MD

## 2018-08-03 NOTE — Patient Instructions (Addendum)
Continue current medications. Continue good therapeutic lifestyle changes which include good diet and exercise. Fall precautions discussed with patient. If an FOBT was given today- please return it to our front desk. If you are over 63 years old - you may need Prevnar 27 or the adult Pneumonia vaccine.  **Flu shots are available--- please call and schedule a FLU-CLINIC appointment**  After your visit with [place] today you will receive a survey in the mail or online from [employer] regarding your care with [place]. Please take a moment to fill this out. Your feedback is very important to [place] as you can help [place] better understand your patient needs as well as improve your experience and satisfaction. WE CARE ABOUT YOU!!!   Continue to follow-up with neurology Consider the Cologuard test for checking for colon cancer This winter drink plenty of fluids and stay well-hydrated Make sure that you get a copy of the CT scan of the lungs sent to [place] by the radiologist Drink plenty of fluids and stay well-hydrated Try MiraLAX for constipation in addition to drinking plenty of water Return the FOBT card

## 2018-08-06 ENCOUNTER — Other Ambulatory Visit: Payer: 59

## 2018-08-06 DIAGNOSIS — G20 Parkinson's disease: Secondary | ICD-10-CM

## 2018-08-06 DIAGNOSIS — E7800 Pure hypercholesterolemia, unspecified: Secondary | ICD-10-CM

## 2018-08-06 DIAGNOSIS — N400 Enlarged prostate without lower urinary tract symptoms: Secondary | ICD-10-CM

## 2018-08-06 DIAGNOSIS — Z0000 Encounter for general adult medical examination without abnormal findings: Secondary | ICD-10-CM

## 2018-08-06 DIAGNOSIS — E559 Vitamin D deficiency, unspecified: Secondary | ICD-10-CM

## 2018-08-07 LAB — LIPID PANEL
CHOLESTEROL TOTAL: 200 mg/dL — AB (ref 100–199)
Chol/HDL Ratio: 3.6 ratio (ref 0.0–5.0)
HDL: 55 mg/dL (ref >39)
LDL CALC: 126 mg/dL — AB (ref 0–99)
TRIGLYCERIDES: 94 mg/dL (ref 0–149)
VLDL Cholesterol Cal: 19 mg/dL (ref 5–40)

## 2018-08-07 LAB — HEPATIC FUNCTION PANEL
ALK PHOS: 70 IU/L (ref 39–117)
ALT: 5 IU/L (ref 0–44)
AST: 20 IU/L (ref 0–40)
Albumin: 4.2 g/dL (ref 3.6–4.8)
Bilirubin Total: 0.6 mg/dL (ref 0.0–1.2)
Bilirubin, Direct: 0.15 mg/dL (ref 0.00–0.40)
TOTAL PROTEIN: 6.7 g/dL (ref 6.0–8.5)

## 2018-08-07 LAB — CBC WITH DIFFERENTIAL/PLATELET
BASOS: 1 %
Basophils Absolute: 0 10*3/uL (ref 0.0–0.2)
EOS (ABSOLUTE): 0.1 10*3/uL (ref 0.0–0.4)
EOS: 2 %
HEMATOCRIT: 42.8 % (ref 37.5–51.0)
HEMOGLOBIN: 14.7 g/dL (ref 13.0–17.7)
IMMATURE GRANS (ABS): 0 10*3/uL (ref 0.0–0.1)
IMMATURE GRANULOCYTES: 0 %
LYMPHS: 43 %
Lymphocytes Absolute: 2 10*3/uL (ref 0.7–3.1)
MCH: 31.7 pg (ref 26.6–33.0)
MCHC: 34.3 g/dL (ref 31.5–35.7)
MCV: 92 fL (ref 79–97)
Monocytes Absolute: 0.4 10*3/uL (ref 0.1–0.9)
Monocytes: 8 %
NEUTROS PCT: 46 %
Neutrophils Absolute: 2.2 10*3/uL (ref 1.4–7.0)
Platelets: 158 10*3/uL (ref 150–450)
RBC: 4.63 x10E6/uL (ref 4.14–5.80)
RDW: 13.2 % (ref 12.3–15.4)
WBC: 4.6 10*3/uL (ref 3.4–10.8)

## 2018-08-07 LAB — VITAMIN D 25 HYDROXY (VIT D DEFICIENCY, FRACTURES): Vit D, 25-Hydroxy: 35.5 ng/mL (ref 30.0–100.0)

## 2018-08-07 LAB — BMP8+EGFR
BUN/Creatinine Ratio: 18 (ref 10–24)
BUN: 18 mg/dL (ref 8–27)
CALCIUM: 9.1 mg/dL (ref 8.6–10.2)
CO2: 25 mmol/L (ref 20–29)
Chloride: 100 mmol/L (ref 96–106)
Creatinine, Ser: 0.98 mg/dL (ref 0.76–1.27)
GFR, EST AFRICAN AMERICAN: 94 mL/min/{1.73_m2} (ref >59)
GFR, EST NON AFRICAN AMERICAN: 82 mL/min/{1.73_m2} (ref >59)
Glucose: 97 mg/dL (ref 65–99)
POTASSIUM: 3.9 mmol/L (ref 3.5–5.2)
Sodium: 139 mmol/L (ref 134–144)

## 2018-08-07 LAB — PSA, TOTAL AND FREE
PROSTATE SPECIFIC AG, SERUM: 0.5 ng/mL (ref 0.0–4.0)
PSA, Free Pct: 68 %
PSA, Free: 0.34 ng/mL

## 2018-11-17 NOTE — Progress Notes (Signed)
Aaron Robertson was seen today in the movement disorders clinic for neurologic consultation at the request of Chipper Herb, MD.  The consultation is for the evaluation of tremor, voice changes and gait change.   Sx's have been going on for 6 months.  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  He states that he would concentrate on it and it would go away.  It actually has been better over the last few weeks/months.  In fact, he states that most of the above sx's are better than they were a few months ago.  02/16/15 update:  The patient presents today for follow-up.  He was diagnosed with Parkinson's disease last visit, but refuses all medication for it, as he unfortunately believes that starting medication would speed up the degenerative process, even though no literature supports this.  He also states today that he just really doesn't feel he needs it.  He cannot tell that he is rigid and generally feels well most days.  Pt brings in an "extra" video clip that lasted a full 6 minutes that he asked me to look at with him regarding the "protandim" that he is now taking.  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  Occasionally rides on the bike.  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  Thinks that there may be a buyout this summer at work and thinks that he may take that if it is an option.  States that tremor is better.    06/29/15 update:  The patient returns today for follow-up.  He has a history of Parkinson's disease and has been fairly rigid, but refuses medications.  He denies hallucinations.  He denies falls.  He denies any near syncopal episodes since our last visit.  I did review records since last visit.  He underwent a right inguinal hernia repair on May 15, 2015.  States that he didn't go back to  work.  He is noting more tremor especially when the hand is at rest and worries about that at work, as he is working with chemicals.  He is noting a loss of voice.    11/20/15 update:  The patient returns today for follow-up.  He is on no medication for Parkinson's disease, as he has refused medication thus far.  He refused LSVT loud at our last visit as well.  I have filled out disability papers on him as he felt that he was no longer able to work.  He is frustrated that his Social Security disability got denied; states that he was examined by a SS IM doctor and a ENT and it was thought that it would go through.  He was previously working with chemicals and felt it unsafe to do so.  Notes that he has R hand tremor when laying in bed.  Reading about supplements and marijuana plants.  When asked why he doesn't want to consider traditional meds, he says that he hasn't read about those (only reads about SE of them).  Asks me about those today.  Having more trouble with buttoning buttons, pulling pants up.    02/20/16 update:  Pt returns for f/u re: PD.  The patient was started on pramipexole last visit and work to 0.5 mg 3 times  a day.  The patient reports good benefit with the medication.  He states that "it is a miracle drug."    He denies compulsive behaviors.  Denies sleep attacks.  Will have mild tremor if nervous or walking.  He can button clothing now and "pull my pants up like I am normal."  Admits that his wife moved out and they are in counseling so that has been a bit stressful.  States that he is losing weight because of that but states that he is eating.  States that he got down to 135 but has gained some weight back.   He was referred for physical therapy since last visit but refused the service.  He wanted speech therapy, but then refused the surface because of financial constraints.  He denies any falls but sometimes drags the feet.  Denies lightheadedness or near syncope.  Denies hallucinations.  Is  doing push ups but hasnt started biking yet.    05/27/16 update:  The patient returns today for follow-up, somewhat earlier than expected.  He remains on pramipexole 0.5 mg 3 times per day.  He had a physical exam since our last visit and I reviewed that data.  He states that his daughter is going to be getting married soon (06/15/16) and he is planning on walking her down the aisle and just wants to avoid any tremor during that situation.  He asks about potentially increasing his medication.  He denies any hallucinations.  He denies compulsive behavior.  He denies lightheadedness or near-syncope.  He continues to exercise.  Reports that he is still under stress regarding fact that wife left him.  Has signed separation agreement.  He did see counselor.   Has been turned down two times for SS disability.  Is still appealing that.   He asks me about refilling his valtrex.  09/30/16 update:  The patient follows up today.  Last visit, I recommended that he increase pramipexole 0.5 mg so that he is taking 2 tablets in the morning, one in the afternoon and 1 in evening but he didn't do that and he is taking pramipexole 0.5 mg tid.    We did talk about taking levodopa just for his daughter's wedding so that he did not have so much tremor and he tells me that he started taking that 0.5 mg tid.  He thinks that it is working well.  Pt denies falls.  Pt denies lightheadedness, near syncope.  No hallucinations.  Mood has been better as he is now dating someone new.  He is doing lots of core exercises but not as much CV exercises.  States that "attitude is everything."  Still riding horses.    04/01/17 update:  Patient seen today in follow-up.  The patient is on pramipexole, 0.5 mg 3 times per day.  The patient denies sleep attacks.  He denies compulsive behaviors.  No cognitive changes.  He denies any falls.  He remains on carbidopa/levodopa 25/100, one tablet 3 times per day.  No hallucinations.  His divorce was final  yesterday.  He asks me to fill out his long term disability form.  He still drools and drags feet but he feels great.  He states that he is exercising and is in the best shape of his life.  He asks how long he can expect this to continue.  Will rarely shuffle if he doesn't "think about it."  Noting some sleep fragmentation.  07/14/17 update:  Patient in today in follow-up for  Parkinson's disease.  Patient is on pramipexole 0.5 mg 3 times a day and carbidopa/levodopa 25/100 tid.  "I wish I knew how to pick up my feet without thinking about it."  "It worries me."  No hallucinations.  No compulsive behaviors.  No sleep attacks.  No cognitive change.  Occasionally will note tremor in the right hand.  He denies falls.  He denies lightheadedness or near syncope.   He does push ups "fast and hard" but no other CV exercise.  Does better with stress now that divorce is over.  States that he was dx with asbestosis since last visit.    11/14/17 update: Patient seen in follow-up for Parkinson's disease he is on pramipexole 0.5 mg, 1 tablet 3 times per day and carbidopa/levodopa 25/100, 1 tablet 3 times per day (taking last at bedtime).  He notes a little tremor in the RUE with ambulation.  No compulsive behaviors.  No sleep attacks.  No hallucinations.  No near syncope.  No falls.  He is exercising, mostly with weightbearing exercises.  He does lots of push ups.  "I have no issues with balance."  He does not do cardiovascular related exercises.  Drags heels some with walking and runs better than walking.  The records that were made available to me were reviewed.  States that he was dx with STD (records indicate HSV) and he is taking lots of vitamins and supplements on the internet that promises to clear up "all STDs."  05/18/18 update: Patient is seen today in follow-up for Parkinson's disease.  The patient is on pramipexole 0.5 mg, 1 tablet 3 times per day and carbidopa/levodopa 25/100, 1 tablet 3 times per day (often times  will forget middle of the day dose and end up with the last one at bedtime).  No compulsive behaviors.  No sleep attacks.  No hallucinations.  No falls.  Notes some dragging of the feet intermittently, especially on gravel/unsteady surfaces.  Taking CBD oil.  Not exercising like he was.  Is planning on getting married in the fall.  11/18/18 update: Patient is seen today in follow-up for Parkinson's disease.  He is on pramipexole, 0.5 mg 3 times per day and carbidopa/levodopa 25/100, 1 tablet 3 times per day.  If he drives a long distance (he is driving to [place] tomorrow), he will take an extra of both levodopa and pramipexole).  He notes a little more tremor.  If he thinks about ambulation, he walks well but he has some dragging of the heels (wears boots) if doesn't think about it.   He denies sleep attacks or compulsive behaviors.  He denies hallucinations.  He denies falls.  Records are reviewed since our last visit.  Saw his primary care physician in October.  Some pain in arm/shoulder.  He did get married since our last visit. Not exercising like he was.    Neuroimaging has not previously been performed.   PREVIOUS MEDICATIONS: none to date  ALLERGIES:  No Known Allergies  CURRENT MEDICATIONS:  Outpatient Encounter Medications as of 11/18/2018  Medication Sig  . carbidopa-levodopa (SINEMET IR) 25-100 MG tablet TAKE 1 TABLET BY MOUTH THREE TIMES A DAY  . MAGNESIUM CITRATE PO Take by mouth.  . pramipexole (MIRAPEX) 0.5 MG tablet TAKE 1 TABLET (0.5 MG TOTAL) BY MOUTH 3 (THREE) TIMES DAILY.  . [DISCONTINUED] Misc Natural Products (T-RELIEF CBD+13 SL) Place under the tongue. CBD oil PRN   No facility-administered encounter medications on file as of 11/18/2018.  PAST MEDICAL HISTORY:   Past Medical History:  Diagnosis Date  . Asbestosis ([place])   . Inguinal hernia 04/2015   right  . Neuromuscular disorder ([place])    Parkinson's disease  . Parkinson's disease ([place])     PAST SURGICAL HISTORY:     Past Surgical History:  Procedure Laterality Date  . COLONOSCOPY    . INGUINAL HERNIA REPAIR Right 05/15/2015   Procedure: OPEN RIGHT INGUINAL HERNIA REPAIR ;  Surgeon: Fanny Skates, MD;  Location: [facility];  Service: General;  Laterality: Right;  . INSERTION OF MESH Right 05/15/2015   Procedure: INSERTION OF MESH;  Surgeon: Fanny Skates, MD;  Location: [facility];  Service: General;  Laterality: Right;    SOCIAL HISTORY:   Social History   Socioeconomic History  . Marital status: Married    Spouse name: Not on file  . Number of children: Not on file  . Years of education: Not on file  . Highest education level: Not on file  Occupational History  . Not on file  Social Needs  . Financial resource strain: Not on file  . Food insecurity:    Worry: Not on file    Inability: Not on file  . Transportation needs:    Medical: Not on file    Non-medical: Not on file  Tobacco Use  . Smoking status: Never Smoker  . Smokeless tobacco: Never Used  Substance and Sexual Activity  . Alcohol use: Yes    Alcohol/week: 0.0 standard drinks    Comment: occasionally  . Drug use: No  . Sexual activity: Not on file  Lifestyle  . Physical activity:    Days per week: Not on file    Minutes per session: Not on file  . Stress: Not on file  Relationships  . Social connections:    Talks on phone: Not on file    Gets together: Not on file    Attends religious service: Not on file    Active member of club or organization: Not on file    Attends meetings of clubs or organizations: Not on file    Relationship status: Not on file  . Intimate partner violence:    Fear of current or ex partner: Not on file    Emotionally abused: Not on file    Physically abused: Not on file    Forced sexual activity: Not on file  Other Topics Concern  . Not on file  Social History Narrative  . Not on file    FAMILY HISTORY:   Family Status  Relation Name Status  . Mother   Alive       heart disease  . Mat Uncle  Deceased  . Father  Deceased       alzheimer's   . PGM  Deceased       ALS  . Sister  Alive       hyperlipidemia  . Sister  Alive       hyperlipidemia  . Son  Alive       healthy  . Daughter  Alive       healthy  . PGF  (Not Specified)    ROS:  Review of Systems  Constitutional: Negative.   HENT: Negative.   Eyes: Negative.   Respiratory: Negative.   Cardiovascular: Negative.   Gastrointestinal: Negative.   Genitourinary: Negative.   Musculoskeletal: Positive for joint pain.  Skin: Negative.     PHYSICAL EXAMINATION:    VITALS:  Vitals:   11/18/18 0815  BP: 120/60  Pulse: 74  SpO2: 97%  Weight: 169 lb (76.7 kg)  Height: 5\' 8"  (1.727 m)   Wt Readings from Last 3 Encounters:  11/18/18 169 lb (76.7 kg)  08/03/18 164 lb (74.4 kg)  05/18/18 161 lb (73 kg)    GEN:  The patient appears stated age and is in NAD. HEENT:  Normocephalic, atraumatic.  The mucous membranes are moist. The superficial temporal arteries are without ropiness or tenderness. CV:  RRR Lungs:  CTAB Neck/HEME:  There are no carotid bruits bilaterally.  Neurological examination:  Orientation: The patient is alert and oriented x3. Cranial nerves: There is good facial symmetry. The speech is fluent and clear. Soft palate rises symmetrically and there is no tongue deviation. Hearing is intact to conversational tone. Sensation: Sensation is intact to light touch throughout Motor: Strength is 5/5 in the bilateral upper and lower extremities.   Shoulder shrug is equal and symmetric.  There is no pronator drift.  Movement examination: Tone: There is no rigidity in the UE/LE Abnormal movements: There is rare rest tremor in the RUE that increases with ambulation Coordination:  There is no decremation, with any form of RAMS, including alternating supination and pronation of the forearm, hand opening and closing, finger taps, heel taps and toe taps. Gait and  Station: The patient has no trouble without the use of the hands.  Walks well with RUE tremor.  Negative pull test  LABS:    Chemistry      Component Value Date/Time   NA 139 08/06/2018 0820   K 3.9 08/06/2018 0820   CL 100 08/06/2018 0820   CO2 25 08/06/2018 0820   BUN 18 08/06/2018 0820   CREATININE 0.98 08/06/2018 0820      Component Value Date/Time   CALCIUM 9.1 08/06/2018 0820   ALKPHOS 70 08/06/2018 0820   AST 20 08/06/2018 0820   ALT 5 08/06/2018 0820   BILITOT 0.6 08/06/2018 0820     Lab Results  Component Value Date   TSH 3.190 04/02/2016   Lab Results  Component Value Date   WBC 4.6 08/06/2018   HGB 14.7 08/06/2018   HCT 42.8 08/06/2018   MCV 92 08/06/2018   PLT 158 08/06/2018      ASSESSMENT/PLAN:  1.  Idiopathic Parkinson's disease, diagnosed in December, 2015  -continue pramipexole 0.5 mg, 1 in the AM, 1 in the afternoon, 1 in the PM.   Risks, benefits, side effects and alternative therapies were discussed including but not limited to sleep attacks and compulsive behaviors.  The opportunity to ask questions was given and they were answered to the best of my ability.  The patient expressed understanding and willingness to follow the outlined treatment protocols.  -continue carbidopa/levodopa 25/100 tid.  Told him that could take an extra if needed, as opposed to taking an extra pramipexole.    -he asks me again about marijuana in PD and discussed data on this.  It is not recommended by [facility].   Follow up is anticipated in the next few months, sooner should new neurologic issues arise.  Much greater than 50% of this visit was spent in counseling and coordinating care.  Total face to face time:  25 min

## 2018-11-18 ENCOUNTER — Ambulatory Visit: Payer: Medicare Other | Admitting: Neurology

## 2018-11-18 ENCOUNTER — Encounter: Payer: Self-pay | Admitting: Neurology

## 2018-11-18 VITALS — BP 120/60 | HR 74 | Ht 68.0 in | Wt 169.0 lb

## 2018-11-18 DIAGNOSIS — G20 Parkinson's disease: Secondary | ICD-10-CM

## 2018-11-18 NOTE — Patient Instructions (Signed)
Exercise!  Good to see you!

## 2018-12-28 ENCOUNTER — Telehealth: Payer: Self-pay | Admitting: Neurology

## 2018-12-28 NOTE — Telephone Encounter (Signed)
Spoke with Vikki Ports and clarified that [employer] will be sending forms for [place] to complete. I haven't received these yet. I will work on them once they are received.

## 2018-12-28 NOTE — Telephone Encounter (Signed)
Reference Claim #: [id number] - [place] [address].

## 2018-12-28 NOTE — Telephone Encounter (Signed)
Patient is needing a letter for [employer] (the medical report). Please send this to fax #: [phone]. Thanks!

## 2019-01-14 ENCOUNTER — Other Ambulatory Visit: Payer: Self-pay | Admitting: Neurology

## 2019-02-03 ENCOUNTER — Other Ambulatory Visit: Payer: Self-pay

## 2019-02-03 ENCOUNTER — Ambulatory Visit (INDEPENDENT_AMBULATORY_CARE_PROVIDER_SITE_OTHER): Payer: Medicare Other | Admitting: Family Medicine

## 2019-02-03 DIAGNOSIS — J61 Pneumoconiosis due to asbestos and other mineral fibers: Secondary | ICD-10-CM

## 2019-02-03 DIAGNOSIS — K4090 Unilateral inguinal hernia, without obstruction or gangrene, not specified as recurrent: Secondary | ICD-10-CM

## 2019-02-03 DIAGNOSIS — G20 Parkinson's disease: Secondary | ICD-10-CM | POA: Diagnosis not present

## 2019-02-03 DIAGNOSIS — M25512 Pain in left shoulder: Secondary | ICD-10-CM

## 2019-02-03 DIAGNOSIS — E559 Vitamin D deficiency, unspecified: Secondary | ICD-10-CM

## 2019-02-03 DIAGNOSIS — M25511 Pain in right shoulder: Secondary | ICD-10-CM

## 2019-02-03 DIAGNOSIS — N400 Enlarged prostate without lower urinary tract symptoms: Secondary | ICD-10-CM

## 2019-02-03 DIAGNOSIS — E7800 Pure hypercholesterolemia, unspecified: Secondary | ICD-10-CM | POA: Diagnosis not present

## 2019-02-03 NOTE — Patient Instructions (Addendum)
Continue current medication Continue to follow-up with Dr. Carles Collet Continue to practice good respiratory and hand hygiene because of COVID-19 Stay active physically and keep your space from other people. Drink plenty of water and fluids and stay well-hydrated If shoulder pain continues, call us and we will do a referral to orthopedics for further evaluation

## 2019-02-03 NOTE — Addendum Note (Signed)
Addended by: Zannie Cove on: 02/03/2019 10:33 AM   Modules accepted: Orders

## 2019-02-03 NOTE — Progress Notes (Signed)
Virtual Visit Via telephone Note I connected with@ on 02/03/19 by telephone and verified that I am speaking with the correct person or authorized healthcare agent using two identifiers. Aaron Robertson is currently located at home and there are no unauthorized people in close proximity. I completed this visit while in a private location in my home .  I connected to the patient by telephone and verified that I was speaking with the correct person.  This visit type was conducted due to national recommendations for restrictions regarding the COVID-19 Pandemic (e.g. social distancing).  This format is felt to be most appropriate for this patient at this time.  All issues noted in this document were discussed and addressed.  No physical exam was performed.    I discussed the limitations, risks, security and privacy concerns of performing an evaluation and management service by telephone and the availability of in person appointments. I also discussed with the patient that there may be a patient responsible charge related to this service. The patient expressed understanding and agreed to proceed.   Date:  02/03/2019    ID:  DYAN CREELMAN      1955-12-30        [id number]   Patient Care Team Patient Care Team: Chipper Herb, MD as PCP - General (Family Medicine) [name], Eustace Quail, DO as Consulting Physician (Neurology)  Reason for Visit: Primary Care Follow-up     History of Present Illness & Review of Systems:     Aaron Robertson is a 63 y.o. year old male primary care patient that presents today for a telehealth visit.  The patient is doing well overall.  He is followed regularly by his neurologist Dr. Carles Collet for his Parkinson's disease.  He is married now.  As far as his Parkinson's disease is concerned he is taking Mirapex and Sinemet.  He is taking each pill 3 times a day.  He is trying to exercise and walk regularly.  He has his ongoing tremor in the right hand and does have some leg  weakness and has since you are developed a slight tremor in the left hand.  The neurologist is aware of this.  He denies any chest pain pressure tightness or shortness of breath anymore than usual.  He does have some occasional heartburn related to his diet but otherwise this is normal and we did discuss taking some Pepcid AC if he knows he is going to go out and eat something that may cause indigestion.  He is passing his water well.  He is up-to-date on his eye exams.  He does not need any refills.  He is due to get some blood work without a PSA in the next month and we will put an order in for him to come in and get this.  He has a history of an elevated LDL-C.  The patient does complain of some bilateral shoulder pain but says it is not severe enough at this point for a referral to orthopedics.  Review of systems as stated otherwise negative for body systems left unmentioned.   The patient does not have symptoms concerning for COVID-19 infection (fever, chills, cough, or new shortness of breath).      Current Medications (Verified) Allergies as of 02/03/2019   No Known Allergies     Medication List       Accurate as of February 03, 2019  8:20 AM. Always use your most recent med list.  carbidopa-levodopa 25-100 MG tablet Commonly known as:  SINEMET IR TAKE 1 TABLET BY MOUTH THREE TIMES A DAY   MAGNESIUM CITRATE PO Take by mouth.   pramipexole 0.5 MG tablet Commonly known as:  MIRAPEX TAKE 1 TABLET (0.5 MG TOTAL) BY MOUTH 3 (THREE) TIMES DAILY.           Allergies (Verified)    Patient has no known allergies.  Past Medical History Past Medical History:  Diagnosis Date  . Asbestosis ([place])   . Inguinal hernia 04/2015   right  . Neuromuscular disorder ([place])    Parkinson's disease  . Parkinson's disease [facility])      Past Surgical History:  Procedure Laterality Date  . COLONOSCOPY    . INGUINAL HERNIA REPAIR Right 05/15/2015   Procedure: OPEN RIGHT INGUINAL HERNIA REPAIR  ;  Surgeon: Fanny Skates, MD;  Location: [place];  Service: General;  Laterality: Right;  . INSERTION OF MESH Right 05/15/2015   Procedure: INSERTION OF MESH;  Surgeon: Fanny Skates, MD;  Location: [place];  Service: General;  Laterality: Right;    Social History   Socioeconomic History  . Marital status: Married    Spouse name: Not on file  . Number of children: Not on file  . Years of education: Not on file  . Highest education level: Not on file  Occupational History  . Not on file  Social Needs  . Financial resource strain: Not on file  . Food insecurity:    Worry: Not on file    Inability: Not on file  . Transportation needs:    Medical: Not on file    Non-medical: Not on file  Tobacco Use  . Smoking status: Never Smoker  . Smokeless tobacco: Never Used  Substance and Sexual Activity  . Alcohol use: Yes    Alcohol/week: 0.0 standard drinks    Comment: occasionally  . Drug use: No  . Sexual activity: Not on file  Lifestyle  . Physical activity:    Days per week: Not on file    Minutes per session: Not on file  . Stress: Not on file  Relationships  . Social connections:    Talks on phone: Not on file    Gets together: Not on file    Attends religious service: Not on file    Active member of club or organization: Not on file    Attends meetings of clubs or organizations: Not on file    Relationship status: Not on file  Other Topics Concern  . Not on file  Social History Narrative  . Not on file     Family History  Problem Relation Age of Onset  . Hearing loss Mother   . Arrhythmia Mother   . Cancer Maternal Uncle        unknown  . Stroke Father 6  . Hypertension Father   . Alzheimer's disease Father   . Seizures Father 49  . Hyperlipidemia Sister   . Hyperlipidemia Sister   . Stroke Paternal Grandfather 71      Labs/Other Tests and Data Reviewed:    Wt Readings from Last 3 Encounters:  11/18/18 169 lb (76.7  kg)  08/03/18 164 lb (74.4 kg)  05/18/18 161 lb (73 kg)   Temp Readings from Last 3 Encounters:  08/03/18 (!) 96.8 F (36 C) (Oral)  07/04/17 98 F (36.7 C) (Oral)  04/02/16 97 F (36.1 C) (Oral)   BP Readings from Last 3 Encounters:  11/18/18 120/60  08/03/18 (!) 89/57  05/18/18 100/70   Pulse Readings from Last 3 Encounters:  11/18/18 74  08/03/18 67  05/18/18 60     No results found for: HGBA1C Lab Results  Component Value Date   LDLCALC 126 (H) 08/06/2018   CREATININE 0.98 08/06/2018       Chemistry      Component Value Date/Time   NA 139 08/06/2018 0820   K 3.9 08/06/2018 0820   CL 100 08/06/2018 0820   CO2 25 08/06/2018 0820   BUN 18 08/06/2018 0820   CREATININE 0.98 08/06/2018 0820      Component Value Date/Time   CALCIUM 9.1 08/06/2018 0820   ALKPHOS 70 08/06/2018 0820   AST 20 08/06/2018 0820   ALT 5 08/06/2018 0820   BILITOT 0.6 08/06/2018 0820         OBSERVATIONS/ OBJECTIVE:     Patient is alert and active and feeling well and positive and upbeat.  He is pleased currently with his Parkinson's treatment that he is receiving from Dr. Carles Collet.  He is practicing good hand and respiratory hygiene.  He was encouraged to watch his diet more closely to keep his LDL cholesterol down more.  He is due to get this rechecked and will come by the office in the next 4 weeks to have blood drawn for his cholesterol.  He will follow-up as planned with Dr. Carles Collet.  Physical exam deferred due to nature of telephonic visit.  ASSESSMENT & PLAN    Time:   Today, I have spent 19 minutes with the patient via telephone discussing the above including Covid precautions.     Visit Diagnoses: 1. Asbestosis ([place]) -We will make sure that the patient continues to get regular CT scans for follow-up of this.  2. Benign prostatic hyperplasia, unspecified whether lower urinary tract symptoms present -Lab work was reviewed and his PSA is remaining low and stable.  He is not  having any trouble with voiding currently.  3. Primary Parkinsonism ([place]) -Continue to follow-up with Dr. Carles Collet and the medications that she has him on.  He will continue with his Sinemet and Mirapex.  4. Vitamin D deficiency -Continue with vitamin D replacement pending results of lab work  5. Pure hypercholesterolemia -Continue with aggressive therapeutic lifestyle changes  6. Right inguinal hernia -No complaints with this.  7.  Bilateral shoulder pain -If shoulder pain continues, patient will let us know and we will do a referral to orthopedics for further evaluation.  Patient says it is not bad enough at this point time for a referral.  Patient Instructions  Continue current medication Continue to follow-up with Dr. Carles Collet Continue to practice good respiratory and hand hygiene because of COVID-19 Stay active physically and keep your space from other people. Drink plenty of water and fluids and stay well-hydrated       The above assessment and management plan was discussed with the patient. The patient verbalized understanding of and has agreed to the management plan. Patient is aware to call the clinic if symptoms persist or worsen. Patient is aware when to return to the clinic for a follow-up visit. Patient educated on when it is appropriate to go to the emergency department.    Chipper Herb, MD [address], [place], [place] [address] Ph [phone]   Arrie Senate MD

## 2019-03-09 ENCOUNTER — Telehealth: Payer: Self-pay | Admitting: Neurology

## 2019-03-09 NOTE — Telephone Encounter (Signed)
Called patient he said back in March forms/ letters from this office was to be fax back to [place] for his long term disability.  Claim# [id number] Phone: [phone]  Will call to get what information they are requesting from our office for patient. I do not see anything on desk or in patient chart scan recently.

## 2019-03-09 NOTE — Telephone Encounter (Signed)
Called Chrissy at [employer] no answer left message to call office back in regards to patient long term disability.

## 2019-03-09 NOTE — Telephone Encounter (Signed)
Patient needs to talk to someone about his benefits that were cut off due to [place] not sending information to [place] . He came up here last month and talked with someone and they are saying they have not gotten anything from [place] please call

## 2019-03-09 NOTE — Telephone Encounter (Signed)
Patient called back and needs to speak with someone regarding his paperwork that was dropped off and his benefits being cut off. Please Call [phone]. Thanks

## 2019-03-10 NOTE — Telephone Encounter (Signed)
Noted Patient aware form is here and that provider is out of office until Friday. He is also aware that provider documentation may not support what form is requesting. He is aware and understands  Form is due by June 3rd

## 2019-03-10 NOTE — Telephone Encounter (Signed)
received hard fax from [place] in regards to patient.  Form placed on your desk for completion.  Restrictions form needs completion from provider.  Are you ok with completing this form?  Pt last seen 11/18/18  Follow up: 05/20/19  Please advise

## 2019-03-10 NOTE — Telephone Encounter (Signed)
Not sure that our documentation will be able to support this as he is doing well on his medication.  We can look at the form but it may require a FCE, which we don't do.

## 2019-03-10 NOTE — Telephone Encounter (Signed)
~  Dr. Carles Collet  Have you seen anything from [facility] financial on this patient for his long term disability? He says that he has a individual claim for his Parkinson. I have search his chart for additional information I see forms from 2018 from [facility]. See below unable to speak with anyone from [facility] about what they need from [place].

## 2019-03-10 NOTE — Telephone Encounter (Signed)
Patient was calling in about the claim. I gave him the update. He said it was easier to get someone on the phone over there if you don't act like you have a claim #. Don't ask for Chrissy as she never answers. He said he's had a tough time reaching people over there and this way works the best to get someone on the phone. Thanks!

## 2019-03-10 NOTE — Telephone Encounter (Signed)
[place] received voice mail for [place] ext [phone] no answer left another message for someone to call me back in regards to patient long term disability claim.

## 2019-03-11 NOTE — Telephone Encounter (Signed)
Received form back from provider with note stating This is an FCE form. I don't complete these. He can see if PCP does but generally FCE are done by rehab.  Called patient to make him aware of this no answer left message on voice mail with provider response.

## 2019-03-11 NOTE — Telephone Encounter (Signed)
Pt returned call he is requesting that forms be fax to PCP office Dr. [name] Attn: Roselyn Reef  Forms fax to PCP office

## 2019-03-11 NOTE — Telephone Encounter (Signed)
Will hold on to form until I here back from patient. Ok to fax to whom he would like to complete form.

## 2019-03-15 ENCOUNTER — Telehealth: Payer: Self-pay | Admitting: Family Medicine

## 2019-03-15 NOTE — Telephone Encounter (Signed)
Papers from [employer] - Dr tats office is faxing them. Insurance co sent to neurologist (dr tat) and they were supposed to come here for [facility] .   Filling these out will save pt a lot of $ on insurance cost.   Please let pt know when done.

## 2019-03-15 NOTE — Telephone Encounter (Signed)
LM for more info from  03/15/19 jhb

## 2019-04-08 NOTE — Telephone Encounter (Signed)
ppw was faxed on 03/22/19, scanned into chart

## 2019-05-18 NOTE — Progress Notes (Signed)
Aaron Robertson was seen today in the movement disorders clinic for neurologic consultation at the request of Chipper Herb, MD.  The consultation is for the evaluation of tremor, voice changes and gait change.   Sx's have been going on for 6 months.  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  He states that he would concentrate on it and it would go away.  It actually has been better over the last few weeks/months.  In fact, he states that most of the above sx's are better than they were a few months ago.  02/16/15 update:  The patient presents today for follow-up.  He was diagnosed with Parkinson's disease last visit, but refuses all medication for it, as he unfortunately believes that starting medication would speed up the degenerative process, even though no literature supports this.  He also states today that he just really doesn't feel he needs it.  He cannot tell that he is rigid and generally feels well most days.  Pt brings in an "extra" video clip that lasted a full 6 minutes that he asked me to look at with him regarding the "protandim" that he is now taking.  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  Occasionally rides on the bike.  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  Thinks that there may be a buyout this summer at work and thinks that he may take that if it is an option.  States that tremor is better.    06/29/15 update:  The patient returns today for follow-up.  He has a history of Parkinsons disease and has been fairly rigid, but refuses medications.  He denies hallucinations.  He denies falls.  He denies any near syncopal episodes since our last visit.  I did review records since last visit.  He underwent a right inguinal hernia repair on May 15, 2015.  States that he didn't go back to  work.  He is noting more tremor especially when the hand is at rest and worries about that at work, as he is working with chemicals.  He is noting a loss of voice.    11/20/15 update:  The patient returns today for follow-up.  He is on no medication for Parkinsons disease, as he has refused medication thus far.  He refused LSVT loud at our last visit as well.  I have filled out disability papers on him as he felt that he was no longer able to work.  He is frustrated that his Social Security disability got denied; states that he was examined by a SS IM doctor and a ENT and it was thought that it would go through.  He was previously working with chemicals and felt it unsafe to do so.  Notes that he has R hand tremor when laying in bed.  Reading about supplements and marijuana plants.  When asked why he doesn't want to consider traditional meds, he says that he hasn't read about those (only reads about SE of them).  Asks me about those today.  Having more trouble with buttoning buttons, pulling pants up.    02/20/16 update:  Pt returns for f/u re: PD.  The patient was started on pramipexole last visit and work to 0.5 mg 3 times  a day.  The patient reports good benefit with the medication.  He states that "it is a miracle drug."    He denies compulsive behaviors.  Denies sleep attacks.  Will have mild tremor if nervous or walking.  He can button clothing now and "pull my pants up like I am normal."  Admits that his wife moved out and they are in counseling so that has been a bit stressful.  States that he is losing weight because of that but states that he is eating.  States that he got down to 135 but has gained some weight back.   He was referred for physical therapy since last visit but refused the service.  He wanted speech therapy, but then refused the surface because of financial constraints.  He denies any falls but sometimes drags the feet.  Denies lightheadedness or near syncope.  Denies hallucinations.  Is  doing push ups but hasnt started biking yet.    05/27/16 update:  The patient returns today for follow-up, somewhat earlier than expected.  He remains on pramipexole 0.5 mg 3 times per day.  He had a physical exam since our last visit and I reviewed that data.  He states that his daughter is going to be getting married soon (06/15/16) and he is planning on walking her down the aisle and just wants to avoid any tremor during that situation.  He asks about potentially increasing his medication.  He denies any hallucinations.  He denies compulsive behavior.  He denies lightheadedness or near-syncope.  He continues to exercise.  Reports that he is still under stress regarding fact that wife left him.  Has signed separation agreement.  He did see counselor.   Has been turned down two times for SS disability.  Is still appealing that.   He asks me about refilling his valtrex.  09/30/16 update:  The patient follows up today.  Last visit, I recommended that he increase pramipexole 0.5 mg so that he is taking 2 tablets in the morning, one in the afternoon and 1 in evening but he didn't do that and he is taking pramipexole 0.5 mg tid.    We did talk about taking levodopa just for his daughter's wedding so that he did not have so much tremor and he tells me that he started taking that 0.5 mg tid.  He thinks that it is working well.  Pt denies falls.  Pt denies lightheadedness, near syncope.  No hallucinations.  Mood has been better as he is now dating someone new.  He is doing lots of core exercises but not as much CV exercises.  States that "attitude is everything."  Still riding horses.    04/01/17 update:  Patient seen today in follow-up.  The patient is on pramipexole, 0.5 mg 3 times per day.  The patient denies sleep attacks.  He denies compulsive behaviors.  No cognitive changes.  He denies any falls.  He remains on carbidopa/levodopa 25/100, one tablet 3 times per day.  No hallucinations.  His divorce was final  yesterday.  He asks me to fill out his long term disability form.  He still drools and drags feet but he feels great.  He states that he is exercising and is in the best shape of his life.  He asks how long he can expect this to continue.  Will rarely shuffle if he doesn't "think about it."  Noting some sleep fragmentation.  07/14/17 update:  Patient in today in follow-up for  Parkinson's disease.  Patient is on pramipexole 0.5 mg 3 times a day and carbidopa/levodopa 25/100 tid.  "I wish I knew how to pick up my feet without thinking about it."  "It worries me."  No hallucinations.  No compulsive behaviors.  No sleep attacks.  No cognitive change.  Occasionally will note tremor in the right hand.  He denies falls.  He denies lightheadedness or near syncope.   He does push ups "fast and hard" but no other CV exercise.  Does better with stress now that divorce is over.  States that he was dx with asbestosis since last visit.    11/14/17 update: Patient seen in follow-up for Parkinson's disease he is on pramipexole 0.5 mg, 1 tablet 3 times per day and carbidopa/levodopa 25/100, 1 tablet 3 times per day (taking last at bedtime).  He notes a little tremor in the RUE with ambulation.  No compulsive behaviors.  No sleep attacks.  No hallucinations.  No near syncope.  No falls.  He is exercising, mostly with weightbearing exercises.  He does lots of push ups.  "I have no issues with balance."  He does not do cardiovascular related exercises.  Drags heels some with walking and runs better than walking.  The records that were made available to me were reviewed.  States that he was dx with STD (records indicate HSV) and he is taking lots of vitamins and supplements on the internet that promises to clear up "all STDs."  05/18/18 update: Patient is seen today in follow-up for Parkinson's disease.  The patient is on pramipexole 0.5 mg, 1 tablet 3 times per day and carbidopa/levodopa 25/100, 1 tablet 3 times per day (often times  will forget middle of the day dose and end up with the last one at bedtime).  No compulsive behaviors.  No sleep attacks.  No hallucinations.  No falls.  Notes some dragging of the feet intermittently, especially on gravel/unsteady surfaces.  Taking CBD oil.  Not exercising like he was.  Is planning on getting married in the fall.  11/18/18 update: Patient is seen today in follow-up for Parkinson's disease.  He is on pramipexole, 0.5 mg 3 times per day and carbidopa/levodopa 25/100, 1 tablet 3 times per day.  If he drives a long distance (he is driving to [place] tomorrow), he will take an extra of both levodopa and pramipexole).  He notes a little more tremor.  If he thinks about ambulation, he walks well but he has some dragging of the heels (wears boots) if doesn't think about it.   He denies sleep attacks or compulsive behaviors.  He denies hallucinations.  He denies falls.  Records are reviewed since our last visit.  Saw his primary care physician in October.  Some pain in arm/shoulder.  He did get married since our last visit. Not exercising like he was.    05/20/19 update: Patient seen today in follow-up for Parkinson's disease.  Patient is on pramipexole 0.5 mg, 1 tablet in the morning, 1 in the afternoon and 1 in the evening.   He denies compulsive behaviors or sleep attacks.  He is also on carbidopa/levodopa 25/100, 1 tablet 3 times per day.  He is exercising at home. Feels that walking isn't quite as good as will drag the right leg some and "it shortens my stride length."  He is able to ride a bike and did fall off it recently when trying to get off of it.  Otherwise no falls.  Shoulder  pain that he had last visit is now gone but not doing push ups like he was.  No swallowing trouble.  Mood is good. Some constipation but not drinking enough water.  Does better if uses Mg citrate.  His insurance has contacted me since last visit, needing a functional capacity evaluation.  We do not do those in our office and  were unable to fill that form out.  Primary care did fill out those forms per chart notes.  I have reviewed other primary care records since our last visit, including notes from April 22.    Neuroimaging has not previously been performed.   PREVIOUS MEDICATIONS: none to date  ALLERGIES:  No Known Allergies  CURRENT MEDICATIONS:  Outpatient Encounter Medications as of 05/20/2019  Medication Sig   carbidopa-levodopa (SINEMET IR) 25-100 MG tablet TAKE 1 TABLET BY MOUTH THREE TIMES A DAY   MAGNESIUM CITRATE PO Take by mouth.   pramipexole (MIRAPEX) 0.5 MG tablet TAKE 1 TABLET (0.5 MG TOTAL) BY MOUTH 3 (THREE) TIMES DAILY.   No facility-administered encounter medications on file as of 05/20/2019.     PAST MEDICAL HISTORY:   Past Medical History:  Diagnosis Date   Asbestosis ([place])    Inguinal hernia 04/2015   right   Neuromuscular disorder ([place])    Parkinson's disease   Parkinson's disease ([place])     PAST SURGICAL HISTORY:   Past Surgical History:  Procedure Laterality Date   COLONOSCOPY     INGUINAL HERNIA REPAIR Right 05/15/2015   Procedure: OPEN RIGHT INGUINAL HERNIA REPAIR ;  Surgeon: Fanny Skates, MD;  Location: [facility];  Service: General;  Laterality: Right;   INSERTION OF MESH Right 05/15/2015   Procedure: INSERTION OF MESH;  Surgeon: Fanny Skates, MD;  Location: [facility];  Service: General;  Laterality: Right;    SOCIAL HISTORY:   Social History   Socioeconomic History   Marital status: Married    Spouse name: Not on file   Number of children: 2   Years of education: Not on file   Highest education level: Some college, no degree  Occupational History   Not on file  Social Needs   Financial resource strain: Not on file   Food insecurity    Worry: Not on file    Inability: Not on file   Transportation needs    Medical: Not on file    Non-medical: Not on file  Tobacco Use   Smoking status: Never Smoker    Smokeless tobacco: Never Used  Substance and Sexual Activity   Alcohol use: Yes    Alcohol/week: 0.0 standard drinks    Comment: occasionally   Drug use: No   Sexual activity: Not on file  Lifestyle   Physical activity    Days per week: Not on file    Minutes per session: Not on file   Stress: Not on file  Relationships   Social connections    Talks on phone: Not on file    Gets together: Not on file    Attends religious service: Not on file    Active member of club or organization: Not on file    Attends meetings of clubs or organizations: Not on file    Relationship status: Not on file   Intimate partner violence    Fear of current or ex partner: Not on file    Emotionally abused: Not on file    Physically abused: Not on file  Forced sexual activity: Not on file  Other Topics Concern   Not on file  Social History Narrative   Pt lives at private home with [facility], has 2 children   Right handed   Drinks tea, no coffee, no soda    FAMILY HISTORY:   Family Status  Relation Name Status   Mother  Alive       heart disease   Mat Uncle  Deceased   Father  Deceased       alzheimer's    PGM  Deceased       ALS   Sister  Alive       hyperlipidemia   Sister  Alive       hyperlipidemia   Son  Alive       healthy   Daughter  Alive       healthy   PGF  (Not Specified)    ROS:  Review of Systems  Constitutional: Negative.   HENT: Negative.   Eyes: Negative.   Cardiovascular: Negative.   Gastrointestinal: Positive for constipation.  Genitourinary: Negative.   Skin: Negative.   Endo/Heme/Allergies: Negative.     PHYSICAL EXAMINATION:    VITALS:   Vitals:   05/20/19 0805  BP: 107/68  Pulse: 62  Temp: 97.9 F (36.6 C)  SpO2: 97%  Weight: 162 lb (73.5 kg)  Height: 5\' 8"  (1.727 m)   Wt Readings from Last 3 Encounters:  05/20/19 162 lb (73.5 kg)  11/18/18 169 lb (76.7 kg)  08/03/18 164 lb (74.4 kg)    GEN:  The patient appears  stated age and is in NAD. HEENT:  Normocephalic, atraumatic.  The mucous membranes are moist. The superficial temporal arteries are without ropiness or tenderness. CV:  RRR Lungs:  CTAB Neck/HEME:  There are no carotid bruits bilaterally.  Neurological examination:  Orientation: The patient is alert and oriented x3. Cranial nerves: There is good facial symmetry. The speech is fluent and clear. Soft palate rises symmetrically and there is no tongue deviation. Hearing is intact to conversational tone. Sensation: Sensation is intact to light touch throughout Motor: Strength is 5/5 in the bilateral upper and lower extremities.   Shoulder shrug is equal and symmetric.  There is no pronator drift.  Movement examination: Tone: There is no rigidity in the UE/LE Abnormal movements: There is rare rest tremor in the RUE that increases with ambulation Coordination:  There is no decremation, with any form of RAMS, including alternating supination and pronation of the forearm, hand opening and closing, finger taps, heel taps and toe taps. Gait and Station: The patient has no trouble without the use of the hands.  Walks well with RUE tremor.  Negative pull test  LABS:    Chemistry      Component Value Date/Time   NA 139 08/06/2018 0820   K 3.9 08/06/2018 0820   CL 100 08/06/2018 0820   CO2 25 08/06/2018 0820   BUN 18 08/06/2018 0820   CREATININE 0.98 08/06/2018 0820      Component Value Date/Time   CALCIUM 9.1 08/06/2018 0820   ALKPHOS 70 08/06/2018 0820   AST 20 08/06/2018 0820   ALT 5 08/06/2018 0820   BILITOT 0.6 08/06/2018 0820     Lab Results  Component Value Date   TSH 3.190 04/02/2016   Lab Results  Component Value Date   WBC 4.6 08/06/2018   HGB 14.7 08/06/2018   HCT 42.8 08/06/2018   MCV 92 08/06/2018   PLT 158  08/06/2018      ASSESSMENT/PLAN:  1.  Idiopathic Parkinson's disease, diagnosed in December, 2015  -continue pramipexole 0.5 mg, 1 in the AM, 1 in the  afternoon, 1 in the PM.   Can take extra as needed.  Risks, benefits, side effects and alternative therapies were discussed including but not limited to sleep attacks and compulsive behaviors.  The opportunity to ask questions was given and they were answered to the best of my ability.  The patient expressed understanding and willingness to follow the outlined treatment protocols.  -continue carbidopa/levodopa 25/100 tid.  Told him that could take an extra if needed, as opposed to taking an extra pramipexole.    -discussed covid, safety and how it relates to him.    -discussed importance of adding cardio into routine  2.   Constipation  -discussed nature and pathophysiology and association with PD  -discussed importance of hydration.  Pt is to increase water intake  -pt is given a copy of the rancho recipe  -recommended daily colace  -recommended miralax prn  3.  Much greater than 50% of this visit was spent in counseling and coordinating care.  Total face to face time:  25 min

## 2019-05-20 ENCOUNTER — Other Ambulatory Visit: Payer: Self-pay

## 2019-05-20 ENCOUNTER — Ambulatory Visit (INDEPENDENT_AMBULATORY_CARE_PROVIDER_SITE_OTHER): Payer: Medicare Other | Admitting: Neurology

## 2019-05-20 ENCOUNTER — Encounter: Payer: Self-pay | Admitting: Neurology

## 2019-05-20 VITALS — BP 107/68 | HR 62 | Temp 97.9°F | Ht 68.0 in | Wt 162.0 lb

## 2019-05-20 DIAGNOSIS — K5901 Slow transit constipation: Secondary | ICD-10-CM | POA: Diagnosis not present

## 2019-05-20 DIAGNOSIS — G20 Parkinson's disease: Secondary | ICD-10-CM | POA: Diagnosis not present

## 2019-05-20 NOTE — Patient Instructions (Addendum)
Constipation and Parkinson's disease:  1.Rancho recipe for constipation in Parkinsons Disease:  -1 cup of unprocessed bran (need to get this at [employer], [employer] or similar type of store), 2 cups of applesauce in 1 cup of prune juice 2.  Increase fiber intake (Metamucil,vegetables) 3.  Regular, moderate exercise can be beneficial. 4.  Avoid medications causing constipation, such as medications like antacids with calcium or magnesium 5.  It's okay to take daily Miralax, and taper if stools become too loose or you experience diarrhea 6.  Stool softeners (Colace) can help with chronic constipation and I recommend you take this daily. 7.  Increase water intake.  You should be drinking 1/2 gallon of water a day as long as you have not been diagnosed with congestive heart failure or renal/kidney failure.  This is probably the single greatest thing that you can do to help your constipation.  

## 2019-06-27 ENCOUNTER — Other Ambulatory Visit: Payer: Self-pay | Admitting: Neurology

## 2019-06-28 NOTE — Telephone Encounter (Signed)
Requested Prescriptions   Pending Prescriptions Disp Refills  . carbidopa-levodopa (SINEMET IR) 25-100 MG tablet [Pharmacy Med Name: CARBIDOPA-LEVODOPA 25-100 TAB] 270 tablet 1    Sig: TAKE 1 TABLET BY MOUTH THREE TIMES A DAY  . pramipexole (MIRAPEX) 0.5 MG tablet [Pharmacy Med Name: PRAMIPEXOLE 0.5 MG TABLET] 270 tablet 1    Sig: TAKE 1 TABLET (0.5 MG TOTAL) BY MOUTH 3 (THREE) TIMES DAILY.   Rx last filled: 01/15/19 #270 1 REFILLS 01/15/19 #270 1 REFILLS   Pt last seen: 05/20/19   Follow up appt scheduled: 11/17/2019

## 2019-11-08 ENCOUNTER — Telehealth: Payer: Self-pay | Admitting: Family Medicine

## 2019-11-08 NOTE — Telephone Encounter (Signed)
Spoke with patient about scheduling an appt to establish care with new provider since Dr Laurance Flatten is no longer with [place]. Pt said he would prefer to see a male provider if possible. Explained to pt that our male providers are not taking new patients at this time but that I would check to see if Dr Livia Snellen or Dr Dettinger would be able to make an exception.

## 2019-11-08 NOTE — Telephone Encounter (Signed)
Patient aware and appt made 

## 2019-11-08 NOTE — Telephone Encounter (Signed)
I will see him. WS

## 2019-11-16 ENCOUNTER — Encounter: Payer: Self-pay | Admitting: Neurology

## 2019-11-16 NOTE — Progress Notes (Signed)
   Due to the COVID-19 crisis, this virtual check-in visit was done via telephone from my office and it was initiated and consent given by this patient and or family.   Telephone (Audio) Visit The purpose of this telephone visit is to provide medical care while limiting exposure to the novel coronavirus.    Consent was obtained for telephone visit and initiated by pt/family:  Yes.   Answered questions that patient had about telehealth interaction:  Yes.   I discussed the limitations, risks, security and privacy concerns of performing an evaluation and management service by telephone. I also discussed with the patient that there may be a patient responsible charge related to this service. The patient expressed understanding and agreed to proceed.  Pt location: Home Physician Location: office Name of referring provider:  Chipper Herb, MD I connected with .Aaron Robertson at patients initiation/request on 11/17/2019 at  8:15 AM EST by telephone and verified that I am speaking with the correct person using two identifiers.  Pt MRN:  [id number] Pt DOB:  03/12/1956   History of Present Illness:  Patient seen today in follow-up for Parkinson's disease.  Admits may miss middle of the day dose and other times just gets it in later in the day.  Not walking as well as he was.  Shorter steps.  More tremor in the R hand.  No SE of medication.  No compulsive behaviors.  No sleep attacks.  Pt denies falls.  Pt denies lightheadedness, near syncope.  No hallucinations.  Mood has been good.  Current Movement d/o meds: Pramipexole, 0.5 mg, 1 tablet 3 times per day. Carbidopa/levodopa 25/100, 1 tablet 3 times per day (can take an extra if needed).   Observations/Objective:   There were no vitals filed for this visit.  A&O x 3 Speech is fluent and clear  Assessment and Plan:   1.  Parkinsons Disease  -slowly increase pramipexole to 0.75 mg tid.  Given a titration schedule (0.75mg  in AM/0.5 mg in  afternoon/0.5 mg in the evening for a week; then 0.75 mg in the AM/0.75 mg in the afternoon/0.5 mg in the evening; then 0.75 mg tid thereafter)  -talked about taking med on a regular schedule and importance of doing so  -Continue carbidopa/levodopa 25/100, 1 tablet 3 times per day.  Can take an extra if needed.  -add CV exercise 2.  Constipation  -discussed nature and pathophysiology and association with PD  -discussed importance of hydration.  Pt is to increase water intake  -recommended daily colace  -recommended miralax prn  Need for in person visit now:  No. Follow Up Instructions:  5 months  -I discussed the assessment and treatment plan with the patient. The patient was provided an opportunity to ask questions and all were answered. The patient agreed with the plan and demonstrated an understanding of the instructions.   The patient was advised to call back or seek an in-person evaluation if the symptoms worsen or if the condition fails to improve as anticipated.    Total Time spent in visit with the patient was:  13 min, of which 100% of the time was spent in counseling.   Pt understands and agrees with the plan of care outlined.     Alonza Bogus, DO

## 2019-11-17 ENCOUNTER — Telehealth (INDEPENDENT_AMBULATORY_CARE_PROVIDER_SITE_OTHER): Payer: Medicare HMO | Admitting: Neurology

## 2019-11-17 ENCOUNTER — Other Ambulatory Visit: Payer: Self-pay

## 2019-11-17 DIAGNOSIS — G20 Parkinson's disease: Secondary | ICD-10-CM

## 2019-11-17 MED ORDER — PRAMIPEXOLE DIHYDROCHLORIDE 0.75 MG PO TABS: 0.7500 mg | ORAL_TABLET | Freq: Three times a day (TID) | ORAL | 1 refills | Status: DC

## 2019-11-17 MED ORDER — CARBIDOPA-LEVODOPA 25-100 MG PO TABS: 1.0000 | ORAL_TABLET | Freq: Three times a day (TID) | ORAL | 1 refills | Status: DC

## 2019-11-18 ENCOUNTER — Ambulatory Visit: Payer: Medicare Other | Admitting: Family Medicine

## 2019-11-30 ENCOUNTER — Ambulatory Visit: Payer: Medicare HMO | Admitting: Family Medicine

## 2020-04-18 NOTE — Progress Notes (Signed)
Assessment/Plan:   1.  Parkinsons Disease  -Continue pramipexole 0.75 mg 3 times daily.  -Continue carbidopa/levodopa 25/100, 1 tablet 3 times per day.  Discussed increasing to qid and patient states that doesn't feel need to do that regularly so told him he could take an extra prn.  2.  Constipation  -discussed nature and pathophysiology and association with PD  -discussed importance of hydration.  Pt is to increase water intake  -pt is given a copy of the rancho recipe  -recommended daily colace  -recommended miralax prn   3.  Follow up is anticipated in the next 4-6 months, sooner should new neurologic issues arise.   Subjective:   Aaron Robertson was seen today in follow up for Parkinsons disease.  My previous records were reviewed prior to todays visit as well as outside records available to me. Pt denies falls.  Pramipexole was increased since our last visit.  No evidence of compulsive behaviors or sleep attacks.  He reports that the increase helped.  Describes some start hesitation.  Admits to more shuffling.  Admits that doesn't take meds on regular schedule like should.  Drove to [place] and took it qid then and helped more.  pt denies lightheadedness, near syncope.  No hallucinations.  occ visual distortions.   Mood has been good.  Current prescribed movement disorder medications: Pramipexole 0.75 mg 3 times per day (increased last visit) Carbidopa/levodopa 25/100, 1 tablet 3 times per day   PREVIOUS MEDICATIONS: Sinemet and Mirapex  ALLERGIES:  No Known Allergies  CURRENT MEDICATIONS:  Outpatient Encounter Medications as of 04/20/2020  Medication Sig  . carbidopa-levodopa (SINEMET IR) 25-100 MG tablet Take 1 tablet by mouth 3 (three) times daily.  Marland Kitchen MAGNESIUM CITRATE PO Take 1 tablet by mouth 2 (two) times a week.   . pramipexole (MIRAPEX) 0.75 MG tablet Take 1 tablet (0.75 mg total) by mouth 3 (three) times daily.   No facility-administered encounter medications on  file as of 04/20/2020.    Objective:   PHYSICAL EXAMINATION:    VITALS:   Vitals:   04/20/20 0817  BP: 109/70  Pulse: 64  SpO2: 99%  Weight: 168 lb (76.2 kg)  Height: 5\' 8"  (1.727 m)    GEN:  The patient appears stated age and is in NAD. HEENT:  Normocephalic, atraumatic.  The mucous membranes are moist. The superficial temporal arteries are without ropiness or tenderness. CV:  RRR Lungs:  CTAB Neck/HEME:  There are no carotid bruits bilaterally.  Neurological examination:  Orientation: The patient is alert and oriented x3. Cranial nerves: There is good facial symmetry with facial hypomimia. The speech is fluent and clear. Soft palate rises symmetrically and there is no tongue deviation. Hearing is intact to conversational tone. Sensation: Sensation is intact to light touch throughout Motor: Strength is at least antigravity x4.  Movement examination: Tone: There is normal tone in the UE/LE Abnormal movements: there is occ RUE rest tremor Coordination:  There is no decremation with RAM's, with any form of RAMS, including alternating supination and pronation of the forearm, hand opening and closing, finger taps, heel taps and toe taps. Gait and Station: The patient has no difficulty arising out of a deep-seated chair without the use of the hands. The patient's stride length is good but he drags the heels.     I have reviewed and interpreted the following labs independently    Chemistry      Component Value Date/Time   NA 139 08/06/2018  0820   K 3.9 08/06/2018 0820   CL 100 08/06/2018 0820   CO2 25 08/06/2018 0820   BUN 18 08/06/2018 0820   CREATININE 0.98 08/06/2018 0820      Component Value Date/Time   CALCIUM 9.1 08/06/2018 0820   ALKPHOS 70 08/06/2018 0820   AST 20 08/06/2018 0820   ALT 5 08/06/2018 0820   BILITOT 0.6 08/06/2018 0820       Lab Results  Component Value Date   WBC 4.6 08/06/2018   HGB 14.7 08/06/2018   HCT 42.8 08/06/2018   MCV 92  08/06/2018   PLT 158 08/06/2018    Lab Results  Component Value Date   TSH 3.190 04/02/2016     Total time spent on today's visit was 30 minutes, including both face-to-face time and nonface-to-face time.  Time included that spent on review of records (prior notes available to me/labs/imaging if pertinent), discussing treatment and goals, answering patient's questions and coordinating care.  Cc:  Claretta Fraise, MD

## 2020-04-20 ENCOUNTER — Other Ambulatory Visit: Payer: Self-pay

## 2020-04-20 ENCOUNTER — Ambulatory Visit: Payer: Medicare HMO | Admitting: Neurology

## 2020-04-20 ENCOUNTER — Encounter: Payer: Self-pay | Admitting: Neurology

## 2020-04-20 VITALS — BP 109/70 | HR 64 | Ht 68.0 in | Wt 168.0 lb

## 2020-04-20 DIAGNOSIS — G20 Parkinson's disease: Secondary | ICD-10-CM | POA: Diagnosis not present

## 2020-04-20 DIAGNOSIS — K5901 Slow transit constipation: Secondary | ICD-10-CM | POA: Diagnosis not present

## 2020-04-20 MED ORDER — PRAMIPEXOLE DIHYDROCHLORIDE 0.75 MG PO TABS: 0.7500 mg | ORAL_TABLET | Freq: Three times a day (TID) | ORAL | 1 refills | Status: DC

## 2020-04-20 NOTE — Patient Instructions (Addendum)
1.  Continue carbidopa/levodopa 25/100, 1 tablet three times per day.  Can take an extra if needed. 2.  Continue pramipexole 0.75 mg three times per day.  Constipation and Parkinson's disease:  1.Rancho recipe for constipation in Parkinsons Disease:  -1 cup of unprocessed bran (need to get this at [employer], [employer] or similar type of store), 2 cups of applesauce in 1 cup of prune juice 2.  Increase fiber intake (Metamucil,vegetables) 3.  Regular, moderate exercise can be beneficial. 4.  Avoid medications causing constipation, such as medications like antacids with calcium or magnesium 5.  It's okay to take daily Miralax, and taper if stools become too loose or you experience diarrhea 6.  Stool softeners (Colace) can help with chronic constipation and I recommend you take this daily. 7.  Increase water intake.  You should be drinking 1/2 gallon of water a day as long as you have not been diagnosed with congestive heart failure or renal/kidney failure.  This is probably the single greatest thing that you can do to help your constipation.

## 2020-05-05 ENCOUNTER — Other Ambulatory Visit: Payer: Self-pay | Admitting: Neurology

## 2020-05-08 DIAGNOSIS — L814 Other melanin hyperpigmentation: Secondary | ICD-10-CM | POA: Diagnosis not present

## 2020-05-08 DIAGNOSIS — L570 Actinic keratosis: Secondary | ICD-10-CM | POA: Diagnosis not present

## 2020-05-08 DIAGNOSIS — L821 Other seborrheic keratosis: Secondary | ICD-10-CM | POA: Diagnosis not present

## 2020-05-08 DIAGNOSIS — D1801 Hemangioma of skin and subcutaneous tissue: Secondary | ICD-10-CM | POA: Diagnosis not present

## 2020-05-10 ENCOUNTER — Telehealth: Payer: Self-pay | Admitting: Neurology

## 2020-05-10 NOTE — Telephone Encounter (Signed)
Patient called in wanting to find out how he would go about stocking up on his medication? He would like to have an extra month or 2 on hand in case of an emergency.

## 2020-05-11 NOTE — Telephone Encounter (Signed)
We are unable to prescribe more than 90-day supply for his medications.  He was was prescribed 93-month supply for both carbidopa-levadopa and pramipexole.

## 2020-05-12 NOTE — Telephone Encounter (Signed)
PT  Called and informed that we are unable to prescribe more than 90-day supply for his medications.  He was was prescribed 72-month supply for both carbidopa-levadopa and pramipexole.

## 2020-07-18 ENCOUNTER — Telehealth: Payer: Self-pay | Admitting: Neurology

## 2020-07-18 NOTE — Telephone Encounter (Signed)
He declined qid levodopa last visit.  Has he changed his mind?

## 2020-07-18 NOTE — Telephone Encounter (Signed)
Patient called and said, "It's becoming harder to walk. I wonder if Dr. Carles Collet may want to increase any of my medications I'm on. I wonder if that would help?"  [facility] [place]

## 2020-07-18 NOTE — Telephone Encounter (Signed)
Spoke with patient who states he is agreeable with taking medication qid. Are there any special times the patient should take his medication that I need to put on the rx?

## 2020-07-19 MED ORDER — CARBIDOPA-LEVODOPA 25-100 MG PO TABS: ORAL_TABLET | ORAL | 2 refills | Status: DC

## 2020-07-19 NOTE — Telephone Encounter (Signed)
Left message for patient informing him of Dr Arturo Morton times of doses. Advised a call back with any questions or concerns.

## 2020-07-19 NOTE — Telephone Encounter (Signed)
Patient called the office back and was given Dr Doristine Devoid recommendations.   He voiced understanding and stated he will do his best.

## 2020-07-19 NOTE — Telephone Encounter (Signed)
6am/10am/2pm/6pm as I think he gets up really early

## 2020-10-16 ENCOUNTER — Other Ambulatory Visit: Payer: Self-pay | Admitting: Neurology

## 2020-10-19 NOTE — Progress Notes (Signed)
Due to the COVID-19 crisis, this telephone visit was done via telephone from my office and it was initiated and consent given by this patient and or family.  Telephone (Audio) Visit The purpose of this telephone visit is to provide medical care while limiting exposure to the novel coronavirus.    Consent was obtained for telephone visit and initiated by pt/family:  Yes.   Answered questions that patient had about telehealth interaction:  Yes.   I discussed the limitations, risks, security and privacy concerns of performing an evaluation and management service by telephone. I also discussed with the patient that there may be a patient responsible charge related to this service. The patient expressed understanding and agreed to proceed.  Pt location: Home Physician Location: office I connected with Aaron Robertson at patients initiation/request on 10/24/2020 at  8:15 AM EST by telephone and verified that I am speaking with the correct person using two identifiers.  Pt MRN:  [id number] Pt DOB:  Feb 25, 1956 Assessment/Plan:   1.  Parkinsons Disease  -Continue pramipexole 0.75 mg 3 times per day  -Continue carbidopa/levodopa 25/100, 1 tablet 4 times per day  -discussed prognosis  -discussed exercise  -We discussed that it used to be thought that levodopa would increase risk of melanoma but now it is believed that Parkinsons itself likely increases risk of melanoma. he is to get regular skin checks.  He does follow regularly with derm.   Subjective:   Aaron Robertson was seen today in follow up for Parkinsons disease.  My previous records were reviewed prior to todays visit as well as outside records available to me.  Patient contacted me since last visit and we decided to increase his levodopa to 4 times per day due to more trouble with ambulation.  there are days he will only take it 3 times a day but most he does it 4 times a day.  He finds it to be a "blessing."  He still rides horses  and drives to [place].  Pt denies falls.  Was having some near falls prior to going up on med but nothing like that now.  Pt denies lightheadedness, near syncope.  No hallucinations.  Mood has been good.  Current prescribed movement disorder medications: Carbidopa/levodopa 25/100 at 6 AM/10 AM/2 PM/6 PM Pramipexole 0.75 mg 3 times per day.   ALLERGIES:  No Known Allergies  CURRENT MEDICATIONS:  Outpatient Encounter Medications as of 10/24/2020  Medication Sig  . carbidopa-levodopa (SINEMET IR) 25-100 MG tablet TAKE 1 TABLET AT 6 AM, 10 AM, 2 PM, AND 6 PM DAILY  . MAGNESIUM CITRATE PO Take 1 tablet by mouth 2 (two) times a week.   . pramipexole (MIRAPEX) 0.75 MG tablet Take 1 tablet (0.75 mg total) by mouth 3 (three) times daily.  . [DISCONTINUED] carbidopa-levodopa (SINEMET IR) 25-100 MG tablet Take 1 tablet at  6am/10am/2pm/6pm   No facility-administered encounter medications on file as of 10/24/2020.    Objective:   PHYSICAL EXAMINATION:    VITALS:  There were no vitals filed for this visit.   Orientation: The patient is alert and oriented x3. Speech was fluent and clear  I have reviewed and interpreted the following labs independently    Chemistry      Component Value Date/Time   NA 139 08/06/2018 0820   K 3.9 08/06/2018 0820   CL 100 08/06/2018 0820   CO2 25 08/06/2018 0820   BUN 18 08/06/2018 0820   CREATININE 0.98 08/06/2018 0820  Component Value Date/Time   CALCIUM 9.1 08/06/2018 0820   ALKPHOS 70 08/06/2018 0820   AST 20 08/06/2018 0820   ALT 5 08/06/2018 0820   BILITOT 0.6 08/06/2018 0820       Lab Results  Component Value Date   WBC 4.6 08/06/2018   HGB 14.7 08/06/2018   HCT 42.8 08/06/2018   MCV 92 08/06/2018   PLT 158 08/06/2018    Lab Results  Component Value Date   TSH 3.190 04/02/2016     Follow Up Instructions:      -I discussed the assessment and treatment plan with the patient. The patient was provided an opportunity to ask  questions and all were answered. The patient agreed with the plan and demonstrated an understanding of the instructions.   The patient was advised to call back or seek an in-person evaluation if the symptoms worsen or if the condition fails to improve as anticipated.    Total Time spent in visit with the patient was:  12 min, of which 100% of the time was spent in counseling re: prognosis and impt of exercise.   Pt understands and agrees with the plan of care outlined.    Cc:  Claretta Fraise, MD

## 2020-10-22 ENCOUNTER — Other Ambulatory Visit: Payer: Self-pay | Admitting: Neurology

## 2020-10-23 NOTE — Telephone Encounter (Signed)
Rx(s) sent to pharmacy electronically.  

## 2020-10-24 ENCOUNTER — Encounter: Payer: Self-pay | Admitting: Neurology

## 2020-10-24 ENCOUNTER — Telehealth (INDEPENDENT_AMBULATORY_CARE_PROVIDER_SITE_OTHER): Payer: Medicare Other | Admitting: Neurology

## 2020-10-24 ENCOUNTER — Other Ambulatory Visit: Payer: Self-pay

## 2020-10-24 DIAGNOSIS — G20 Parkinson's disease: Secondary | ICD-10-CM | POA: Diagnosis not present

## 2020-10-24 MED ORDER — CARBIDOPA-LEVODOPA 25-100 MG PO TABS: ORAL_TABLET | ORAL | 1 refills | Status: DC

## 2021-01-19 ENCOUNTER — Other Ambulatory Visit: Payer: Self-pay | Admitting: Neurology

## 2021-01-22 NOTE — Telephone Encounter (Signed)
Rx(s) sent to pharmacy electronically.  

## 2021-04-17 NOTE — Progress Notes (Signed)
Assessment/Plan:   1.  Parkinsons Disease  -Continue carbidopa/levodopa 25/100, 1 tablet at 6 AM/10 AM/2 PM/6 PM  -Continue pramipexole 0.75 mg 3 times per day.  -discussed phenomenon of freezing.  2.  Sialorrhea  -This is commonly associated with PD.  We talked about treatments.  The patient is not a candidate for oral anticholinergic therapy because of increased risk of confusion and falls.  We discussed Botox (type A and B) and 1% atropine drops.  We discusssed that candy like lemon drops can help by stimulating mm of the oropharynx to induce swallowing.  Doesn't want to do botox.    Subjective:   Aaron Robertson was seen today in follow up for Parkinsons disease.  My previous records were reviewed prior to todays visit as well as outside records available to me.  Last visit in January, I was only able to connect with him via phone visit.  My last in person visit was about a year ago.  Pt denies falls.  Pt denies lightheadedness, near syncope.  No hallucinations.  Mood has been good. 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  C/o drooling.  Overall, feels good and wonders if he is "getting better."  Current prescribed movement disorder medications: Carbidopa/levodopa 25/100 at 6 AM/10 AM/2 PM/6 PM (states he takes 3-4 per day) Pramipexole 0.75 mg 3 times per day.   ALLERGIES:  No Known Allergies  CURRENT MEDICATIONS:  Outpatient Encounter Medications as of 05/01/2021  Medication Sig   carbidopa-levodopa (SINEMET IR) 25-100 MG tablet TAKE 1 TABLET AT 6 AM, 10 AM, 2 PM, AND 6 PM DAILY   MAGNESIUM CITRATE PO Take 1 tablet by mouth 2 (two) times a week.    pramipexole (MIRAPEX) 0.75 MG tablet TAKE 1 TABLET BY MOUTH THREE TIMES A DAY   No facility-administered encounter medications on file as of 05/01/2021.    Objective:   PHYSICAL EXAMINATION:    VITALS:  There were no vitals filed for this visit.  GEN:  The patient appears  stated age and is in NAD. HEENT:  Normocephalic, atraumatic.  The mucous membranes are moist. The superficial temporal arteries are without ropiness or tenderness. CV:  RRR Lungs:  CTAB Neck/HEME:  There are no carotid bruits bilaterally.  Neurological examination:  Orientation: The patient is alert and oriented x3. Cranial nerves: There is good facial symmetry with facial hypomimia. The speech is fluent and clear. Soft palate rises symmetrically and there is no tongue deviation. Hearing is intact to conversational tone. Sensation: Sensation is intact to light touch throughout Motor: Strength is at least antigravity x4.  Movement examination: Tone: There is nl tone in the UE/LE Abnormal movements: none today Coordination:  There is no decremation with RAM's, with any form of RAMS, including alternating supination and pronation of the forearm, hand opening and closing, finger taps, heel taps and toe taps. Gait and Station: The patient has no difficulty arising out of a deep-seated chair without the use of the hands. The patient's stride length is good and shuffles the heels (in cowboy boots).    I have reviewed and interpreted the following labs independently    Chemistry      Component Value Date/Time   NA 139 08/06/2018 0820   K 3.9 08/06/2018 0820   CL 100 08/06/2018 0820   CO2 25 08/06/2018 0820   BUN 18 08/06/2018 0820   CREATININE 0.98 08/06/2018 0820  Component Value Date/Time   CALCIUM 9.1 08/06/2018 0820   ALKPHOS 70 08/06/2018 0820   AST 20 08/06/2018 0820   ALT 5 08/06/2018 0820   BILITOT 0.6 08/06/2018 0820       Lab Results  Component Value Date   WBC 4.6 08/06/2018   HGB 14.7 08/06/2018   HCT 42.8 08/06/2018   MCV 92 08/06/2018   PLT 158 08/06/2018    Lab Results  Component Value Date   TSH 3.190 04/02/2016     Total time spent on today's visit was 20 minutes, including both face-to-face time and nonface-to-face time.  Time included that spent  on review of records (prior notes available to me/labs/imaging if pertinent), discussing treatment and goals, answering patient's questions and coordinating care.  Cc:  Claretta Fraise, MD

## 2021-04-22 ENCOUNTER — Other Ambulatory Visit: Payer: Self-pay | Admitting: Neurology

## 2021-05-01 ENCOUNTER — Encounter: Payer: Self-pay | Admitting: Neurology

## 2021-05-01 ENCOUNTER — Ambulatory Visit: Payer: Medicare Other | Admitting: Neurology

## 2021-05-01 ENCOUNTER — Other Ambulatory Visit: Payer: Self-pay

## 2021-05-01 VITALS — BP 108/60 | HR 79 | Ht 67.0 in | Wt 168.0 lb

## 2021-05-01 DIAGNOSIS — G20 Parkinson's disease: Secondary | ICD-10-CM | POA: Diagnosis not present

## 2021-05-01 NOTE — Patient Instructions (Signed)
Parkinson's Disease Parkinson's disease causes problems with movements. It is a long-term condition. It gets worse over time (is progressive). It affects each person in different ways. It makes it harder for you to: Control how your body moves. Move your body normally. The condition can range from mild to very bad (advanced). What are the causes? This condition results from a loss of brain cells called neurons. These brain cells make a chemical called dopamine, which is needed to control body movement. As the condition gets worse, the brain cells make less dopamine. Thismakes it hard to move or control your movements. The exact cause of this condition is not known. What increases the risk? Being male. Being age 66 or older. Having family members who had Parkinson's disease. Having had an injury to the brain. Being very sad (depressed). Being around things that are harmful or poisonous. What are the signs or symptoms? Symptoms of this condition can vary. The main symptoms have to do with movement. These include: A tremor or shaking while you are resting that you cannot control. Stiffness in your neck, arms, and legs. Slowing of movement. This may include: Losing expressions of the face. Having trouble making small movements that are needed to button your clothing or brush your teeth. Walking in a way that is not normal. You may walk with short, shuffling steps. Loss of balance when standing. You may sway, fall backward, or have trouble making turns. Other symptoms include: Being very sad, worried, or confused. Seeing or hearing things that are not real. Losing thinking abilities (dementia). Trouble speaking or swallowing. Having a hard time pooping (constipation). Needing to pee right away, peeing often, or not being able to control when you pee or poop. Sleep problems. How is this treated? There is no cure. The goal of treatment is to manage your symptoms. Treatment may  include: Medicines. Therapy to help with talking or movement. Surgery to reduce shaking and other movements that you cannot control. Follow these instructions at home: Medicines Take over-the-counter and prescription medicines only as told by your doctor. Avoid taking pain or sleeping medicines. Eating and drinking Follow instructions from your doctor about what you cannot eat or drink. Do not drink alcohol. Activity Talk with your doctor about if it is safe for you to drive. Do exercises as told by your doctor. Lifestyle     Put in grab bars and railings in your home. These help to prevent falls. Do not use any products that contain nicotine or tobacco, such as cigarettes, e-cigarettes, and chewing tobacco. If you need help quitting, ask your doctor. Join a support group. General instructions Talk with your doctor about what you need help with and what your safety needs are. Keep all follow-up visits as told by your doctor, including any therapy visits to help with talking or moving. This is important. Contact a doctor if: Medicines do not help your symptoms. You feel off-balance. You fall at home. You need more help at home. You have trouble swallowing. You have a very hard time pooping. You have a lot of side effects from your medicines. You feel very sad, worried, or confused. Get help right away if: You were hurt in a fall. You see or hear things that are not real. You cannot swallow without choking. You have chest pain or trouble breathing. You do not feel safe at home. You have thoughts about hurting yourself or others. If you ever feel like you may hurt yourself or others, or have  thoughts about taking your own life, get help right away. You can go to your nearest emergency department or call: Your local emergency services (911 in the [place] A suicide crisis helpline, such as the [facility] at 660-732-5324. This is open 24 hours a  day. Summary This condition causes problems with movements. It is a long-term condition. It gets worse over time. There is no cure. Treatment focuses on managing your symptoms. Talk with your doctor about what you need help with and what your safety needs are. Keep all follow-up visits as told by your doctor. This is important. This information is not intended to replace advice given to you by your health care provider. Make sure you discuss any questions you have with your healthcare provider. Document Revised: 09/12/2020 Document Reviewed: 12/17/2018 Elsevier Patient Education  [employer].

## 2021-07-13 ENCOUNTER — Other Ambulatory Visit: Payer: Self-pay | Admitting: Neurology

## 2021-07-13 DIAGNOSIS — G20 Parkinson's disease: Secondary | ICD-10-CM

## 2021-10-05 ENCOUNTER — Other Ambulatory Visit: Payer: Self-pay

## 2021-10-05 ENCOUNTER — Telehealth: Payer: Self-pay | Admitting: Neurology

## 2021-10-05 DIAGNOSIS — G20 Parkinson's disease: Secondary | ICD-10-CM

## 2021-10-05 MED ORDER — PRAMIPEXOLE DIHYDROCHLORIDE 0.75 MG PO TABS: 0.7500 mg | ORAL_TABLET | Freq: Four times a day (QID) | ORAL | 0 refills | Status: DC

## 2021-10-05 MED ORDER — CARBIDOPA-LEVODOPA 25-100 MG PO TABS: 1.0000 | ORAL_TABLET | Freq: Four times a day (QID) | ORAL | 0 refills | Status: DC

## 2021-10-05 NOTE — Telephone Encounter (Signed)
Pt states that Dr [name] changed his dosage on his medication and we need to call in the corrected RX so that he can get the medication. [facility] [place] [facility]  Pramipexole

## 2021-10-05 NOTE — Telephone Encounter (Signed)
Called patient to let him know the two medication Carbidopa Levodopa and pramipexole have [facility]

## 2021-10-10 ENCOUNTER — Telehealth: Payer: Self-pay | Admitting: Neurology

## 2021-10-10 NOTE — Telephone Encounter (Signed)
error 

## 2021-11-02 NOTE — Progress Notes (Signed)
Assessment/Plan:   1.  Parkinsons Disease  -Continue carbidopa/levodopa 25/100, 1 tablet at 6 AM/10 AM/2 PM/6 PM (he often takes tid)  -Continue pramipexole 0.75 mg 3 times per day.  Told him to take this tid and not qid (he will take it qid when takes levodopa qid now)   2.  Sialorrhea  -This is commonly associated with PD.  We talked about treatments.  The patient is not a candidate for oral anticholinergic therapy because of increased risk of confusion and falls.  We discussed Botox (type A and B) and 1% atropine drops.  We discusssed that candy like lemon drops can help by stimulating mm of the oropharynx to induce swallowing.  Doesn't want to do botox.    Subjective:   Aaron Robertson was seen today in follow up for Parkinsons disease.  My previous records were reviewed prior to todays visit as well as outside records available to me.  He is having no falls.  There have been days where he couldn't tell he had the disease and days he has freezing.  No lightheadedness or near syncope.  No hallucinations.  Drooling is worse - carries towel with him.  Struggles to get up from soft chair.  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    Current prescribed movement disorder medications: Carbidopa/levodopa 25/100 at 6 AM/10 AM/2 PM/6 PM (states he takes 3-4 per day) Pramipexole 0.75 mg 3 times per day (he takes this one four times per day when he takes levodopa four times per day)   ALLERGIES:  No Known Allergies  CURRENT MEDICATIONS:  Outpatient Encounter Medications as of 11/06/2021  Medication Sig   carbidopa-levodopa (SINEMET IR) 25-100 MG tablet Take 1 tablet by mouth 4 (four) times daily. TAKE 1 TABLET AT 6 AM, 10 AM, 2 PM, AND 6 PM DAILY   pramipexole (MIRAPEX) 0.75 MG tablet Take 1 tablet (0.75 mg total) by mouth 4 (four) times daily.   No facility-administered encounter medications on file as of 11/06/2021.    Objective:   PHYSICAL EXAMINATION:     VITALS:   Vitals:   11/06/21 0813  BP: 107/69  Pulse: 78  SpO2: 100%  Weight: 167 lb (75.8 kg)  Height: 5\' 8"  (1.727 m)    GEN:  The patient appears stated age and is in NAD. HEENT:  Normocephalic, atraumatic.  The mucous membranes are moist. The superficial temporal arteries are without ropiness or tenderness. CV:  RRR Lungs:  CTAB Neck/HEME:  There are no carotid bruits bilaterally.  Neurological examination:  Orientation: The patient is alert and oriented x3. Cranial nerves: There is good facial symmetry with facial hypomimia. The speech is fluent and clear.  He is hypophonic.  Soft palate rises symmetrically and there is no tongue deviation. Hearing is intact to conversational tone. Sensation: Sensation is intact to light touch throughout Motor: Strength is at least antigravity x4.  Movement examination: Tone: There is nl tone in the UE/LE Abnormal movements: none today Coordination:  There is no decremation with RAM's, with any form of RAMS, including alternating supination and pronation of the forearm, hand opening and closing, finger taps, heel taps and toe taps. Gait and Station: The patient has no difficulty arising out of a deep-seated chair without the use of the hands. The patient's stride length is good and shuffles the heels (in cowboy boots).    I have reviewed and interpreted the following labs independently    Chemistry  Component Value Date/Time   NA 139 08/06/2018 0820   K 3.9 08/06/2018 0820   CL 100 08/06/2018 0820   CO2 25 08/06/2018 0820   BUN 18 08/06/2018 0820   CREATININE 0.98 08/06/2018 0820      Component Value Date/Time   CALCIUM 9.1 08/06/2018 0820   ALKPHOS 70 08/06/2018 0820   AST 20 08/06/2018 0820   ALT 5 08/06/2018 0820   BILITOT 0.6 08/06/2018 0820       Lab Results  Component Value Date   WBC 4.6 08/06/2018   HGB 14.7 08/06/2018   HCT 42.8 08/06/2018   MCV 92 08/06/2018   PLT 158 08/06/2018    Lab Results   Component Value Date   TSH 3.190 04/02/2016      Cc:  Claretta Fraise, MD

## 2021-11-06 ENCOUNTER — Other Ambulatory Visit: Payer: Self-pay

## 2021-11-06 ENCOUNTER — Encounter: Payer: Self-pay | Admitting: Neurology

## 2021-11-06 ENCOUNTER — Ambulatory Visit: Payer: Medicare Other | Admitting: Neurology

## 2021-11-06 DIAGNOSIS — G20 Parkinson's disease: Secondary | ICD-10-CM | POA: Diagnosis not present

## 2021-11-06 MED ORDER — PRAMIPEXOLE DIHYDROCHLORIDE 0.75 MG PO TABS: 0.7500 mg | ORAL_TABLET | Freq: Three times a day (TID) | ORAL | 1 refills | Status: DC

## 2021-11-06 MED ORDER — CARBIDOPA-LEVODOPA 25-100 MG PO TABS: 1.0000 | ORAL_TABLET | Freq: Four times a day (QID) | ORAL | 1 refills | Status: DC

## 2022-02-01 ENCOUNTER — Telehealth: Payer: Self-pay | Admitting: Neurology

## 2022-02-01 NOTE — Telephone Encounter (Signed)
Patient called in wanting to speak with tats nurse. He heard about the new punch biopsy and he would like to know more info ?

## 2022-02-01 NOTE — Telephone Encounter (Signed)
Called patient and gave Dr. Tats recommendations  

## 2022-04-02 NOTE — Progress Notes (Unsigned)
Assessment/Plan:   1.  Parkinsons Disease  -Continue carbidopa/levodopa 25/100, 1 tablet at 6 AM/10 AM/2 PM/6 PM (he usually takes tid)  -Continue pramipexole 0.75 mg 3 times per day.    -exercise  -We discussed that it used to be thought that levodopa would increase risk of melanoma but now it is believed that Parkinsons itself likely increases risk of melanoma. he is to get regular skin checks.  He has an appt in a month  -I worry about him getting on/off horses but he states that he will continue doing that   2.  Sialorrhea  -This is commonly associated with PD.  We talked about treatments.  The patient is not a candidate for oral anticholinergic therapy because of increased risk of confusion and falls.  We discussed Botox (type A and B) and 1% atropine drops.  We discusssed that candy like lemon drops can help by stimulating mm of the oropharynx to induce swallowing.  Doesn't want to do botox but wife wants him to consider.  He can let me know if changes mind.    Subjective:   Aaron Robertson was seen today in follow up for Parkinsons disease.  My previous records were reviewed prior to todays visit as well as outside records available to me.  He is having no falls.  Has some stutter steps.  Overall, he feels well.  Last visit, I told him not to take his pramipexole 4 times per day, which is what he previously was doing.   "I never take them like I should."  States that he generally takes them both 3 times per day and never at same time.  He has no compulsive behaviors or sleep attacks.  No hallucination.  Has vivid, violent dreams.  No rbd.    Current prescribed movement disorder medications: Carbidopa/levodopa 25/100 at 6 AM/10 AM/2 PM/6 PM (states he takes 3 times per day) Pramipexole 0.75 mg 3 times per day    ALLERGIES:  No Known Allergies  CURRENT MEDICATIONS:  Outpatient Encounter Medications as of 04/03/2022  Medication Sig   carbidopa-levodopa (SINEMET IR) 25-100 MG  tablet Take 1 tablet by mouth 4 (four) times daily. TAKE 1 TABLET AT 6 AM, 10 AM, 2 PM, AND 6 PM DAILY   pramipexole (MIRAPEX) 0.75 MG tablet Take 1 tablet (0.75 mg total) by mouth 3 (three) times daily.   No facility-administered encounter medications on file as of 04/03/2022.    Objective:   PHYSICAL EXAMINATION:    VITALS:   Vitals:   04/03/22 0808  BP: (!) 102/58  Pulse: 74  SpO2: 99%  Weight: 168 lb 4.8 oz (76.3 kg)  Height: '5\' 8"'$  (1.727 m)     GEN:  The patient appears stated age and is in NAD. HEENT:  Normocephalic, atraumatic.  The mucous membranes are moist. The superficial temporal arteries are without ropiness or tenderness. CV:  RRR Lungs:  CTAB Neck/HEME:  There are no carotid bruits bilaterally.  Neurological examination:  Orientation: The patient is alert and oriented x3. Cranial nerves: There is good facial symmetry with facial hypomimia. The speech is fluent and clear.  He is hypophonic.  Soft palate rises symmetrically and there is no tongue deviation. Hearing is intact to conversational tone. Sensation: Sensation is intact to light touch throughout Motor: Strength is at least antigravity x4.  Movement examination: Tone: There is nl tone in the UE/LE Abnormal movements: none today Coordination:  There is no decremation with RAM's, with any  form of RAMS, including alternating supination and pronation of the forearm, hand opening and closing, finger taps, heel taps and toe taps. Gait and Station: The patient has no difficulty arising out of a deep-seated chair without the use of the hands. The patient's stride length is good and shuffles the heels (in cowboy boots).  This is stable from previous  I have reviewed and interpreted the following labs independently    Chemistry      Component Value Date/Time   NA 139 08/06/2018 0820   K 3.9 08/06/2018 0820   CL 100 08/06/2018 0820   CO2 25 08/06/2018 0820   BUN 18 08/06/2018 0820   CREATININE 0.98  08/06/2018 0820      Component Value Date/Time   CALCIUM 9.1 08/06/2018 0820   ALKPHOS 70 08/06/2018 0820   AST 20 08/06/2018 0820   ALT 5 08/06/2018 0820   BILITOT 0.6 08/06/2018 0820       Lab Results  Component Value Date   WBC 4.6 08/06/2018   HGB 14.7 08/06/2018   HCT 42.8 08/06/2018   MCV 92 08/06/2018   PLT 158 08/06/2018    Lab Results  Component Value Date   TSH 3.190 04/02/2016       Cc:  Claretta Fraise, MD

## 2022-04-03 ENCOUNTER — Encounter: Payer: Self-pay | Admitting: Neurology

## 2022-04-03 ENCOUNTER — Ambulatory Visit: Payer: Medicare Other | Admitting: Neurology

## 2022-04-03 VITALS — BP 102/58 | HR 74 | Ht 68.0 in | Wt 168.3 lb

## 2022-04-03 DIAGNOSIS — G20 Parkinson's disease: Secondary | ICD-10-CM | POA: Diagnosis not present

## 2022-04-03 NOTE — Patient Instructions (Signed)
Local and Online Resources for Power over Parkinson's Group June 2023  LOCAL  PARKINSON'S GROUPS  Power over Parkinson's Group:   Power Over Parkinson's Patient Education Group will be Wednesday, June 14th-*Hybrid meting*- in person at [place] [facility] location and via WEBEX at 2:00 pm.   Upcoming Power over Parkinson's Meetings:  2nd Wednesdays of the month at 2 pm:   June 14th, July 12th Contact Amy Marriott at [email or web address] if interested in participating in this group Parkinson's Care Partners Group:    3rd Mondays, Contact Misty Paladino Atypical Parkinsonian Patient Group:   4th Wednesdays, Contact Misty Paladino If you are interested in participating in these groups with Misty, please contact her directly for how to join those meetings.  Her contact information is [email or web address].    LOCAL EVENTS AND NEW OFFERINGS Dance Class for People with Parkinson's at [employer].  Friday, June 9th at 2 pm.  Led by [employer] DPT students.  Contact [email or web address] to register or with questions. Ice Cream Social at [employer]!  Thursday, June 15th, 5:30-7:00 pm.  RSVP to [email or web address] for attendance and free ice cream. Parkinson's T-shirts for sale!  Designed by a local group member, with funds going to Movement Disorders Fund.  $25.00  Contact Misty to purchase  New PWR! Moves Community Fitness Instructor-Led Class offering at [employer]!  Wednesdays 1-2 pm, starting April 12th.   Contact Susan Laney, Fitness Manager at [employer].  [email or web address]  ONLINE EDUCATION AND SUPPORT Parkinson Foundation:  www.parkinson.org PD Health at Home continues:  Mindfulness Mondays, Wellness Wednesdays, Fitness Fridays  Upcoming Education: Parkinson's 101:  What You and Your Family Should Know.  Wednesday, June 7th at 1:00 pm Register for expert briefings (webinars) at  https://www.[email or web address].org/resources-support/online-education/expert-briefings-webinars Please check out their website to sign up for emails and see their full online offerings   [employer]brahm [employer] Fox Foundation:  www.michaeljfox.org  Third Thursday Webinars:  On the third Thursday of every month at 12 p.m. ET, join our free live webinars to learn about various aspects of living with Parkinson's disease and our work to speed medical breakthroughs. Upcoming Webinar: REPLAY:  From Low Blood Pressure to Bladder Problems:  A Look at Lesser Known Parkinson's Symptoms.  Thursday, June 15th at 12 noon. Check out additional information on their website to see their full online offerings  Davis Phinney Foundation:  www.davisphinneyfoundation.org Upcoming Webinar:   Stay tuned Webinar Series:  Living with Parkinson's Meetup.   Third Thursdays each month, 3 pm Care Partner Monthly Meetup.  With Connie Carpenter Phinney.  First Tuesday of each month, 2 pm Check out additional information to Live Well Today on their website  Parkinson and Movement Disorders (PMD) Alliance:  www.pmdalliance.org [employer] Online:  Online Education Events Sign up for emails, which are sent weekly to give you updates on programming and online offerings  Parkinson's Association of [place]:  www.parkinsonassociation.org Information on online support groups, education events, and online exercises including Yoga, Parkinson's exercises and more-LOTS of information on links to PD resources and online events Virtual Support Group through Parkinson's Association of [place]; next one is scheduled for Wednesday, June 7th at 2 pm. (These are typically scheduled for the 1st Wednesday of the month at 2 pm).  Visit website for details. Save the date for "Caring for Parkinson's-Caring for You", 9th Annual Symposium.  In-person event in [place].  September 9th.  More info on registration to come. MOVEMENT AND EXERCISE OPPORTUNITIES PWR!  Moves Classes at [facility].  Wednesdays 10 and 11   am.   Contact Amy Marriott, PT [email or web address] if interested. NEW PWR! Moves Class offering at [employer].  Wednesdays 1-2 pm, starting April 12th.  Contact Susan Laney, Fitness Manager at [employer].  [email or web address] Here is a link to the PWR!Moves classes on Zoom from [place] Parkinson's Foundation - Daily Mon-Sat at 10:00. Via Zoom, FREE and open to all.  There is also a link below via Facebook if you use that platform.  https://www.parkinsonsmi.org/mpf-programs/exercise-and-movement-activities https://www.facebook.com/ParkinsonsMI.org/posts/pwr-moves-exercise-class-parkinson-wellness-recovery-online-with-angee-ludwa-pt-/10156827878021813/  Parkinson's Wellness Recovery (PWR! Moves)  www.pwr4life.org Info on the PWR! Virtual Experience:  You will have access to our expertise through self-assessment, guided plans that start with the PD-specific fundamentals, educational content, tips, Q&A with an expert, and a growing library of PD-specific pre-recorded and live exercise classes of varying types and intensity - both physical and cognitive! If that is not enough, we offer 1:1 wellness consultations (in-person or virtual) to personalize your PWR! Virtual Experience.  Parkinson Foundation Fitness Fridays:  As part of the PD Health @ Home program, this free video series focuses each week on one aspect of fitness designed to support people living with Parkinson's.  These weekly videos highlight the Parkinson Foundation recent fitness guidelines for people with Parkinson's disease. www.parkinson.org/resources-support/online-education/pdhealth#ff Dance for PD website is offering free, live-stream classes throughout the week, as well as links to digital library of classes:  https://danceforparkinsons.org/ Virtual dance and Pilates for Parkinson's classes: Click on the Community Tab> Parkinson's Movement Initiative  Tab.  To register for classes and for more information, visit www.americandancefestival.org and click the "community" tab.  [employer] Parkinson's Cycling Classes  [employer]:  Thursdays @ Noon-Live classes at [employer] (Contact Margaret Hazen at [email or web address] or [phone]) [employer]: Virtual Classes Mondays and Thursdays /Live classes Tuesday, Wednesday and Thursday (contact Marlee at [email or web address]  or [phone]) [place] [employer] Varied levels of classes are offered Tuesdays and Thursdays at [employer].  Stretching with Maria weekly class is also offered for people with Parkinson's To observe a class or for more information, call [phone] or email Hillary Savage at [email or web address] ADDITIONAL SUPPORT AND RESOURCES [facility]:Online Caregiver Education Opportunities:  www.well-springsolutions.org/caregiver-education/caregiver-support-group.  You may also contact Jodi Kolada at [email or web address] or [phone].    [facility] Navigator:  Just1Navigator program, a free service to help individuals and families through the journey of determining care for older adults.  The "Navigator" is a social worker, Nicole Reynolds, who will speak with a prospective client and/or loved ones to provide an assessment of the situation and a set of recommendations for a personalized care plan -- all free of charge, and whether [facility] [employer] offers the needed service or not. If the need is not a service we provide, we are well-connected with reputable programs in town that we can refer you to.  www.well-springsolutions.org or to speak with the Navigator, call [phone]. Family Caregiver Programming in June:  [employer], Thursday, June 15th 11-12:30 at [employer], [place].  Call [phone] to register  

## 2022-05-16 ENCOUNTER — Ambulatory Visit: Payer: Medicare Other | Admitting: Neurology

## 2022-05-20 DIAGNOSIS — G20 Parkinson's disease: Secondary | ICD-10-CM | POA: Diagnosis not present

## 2022-05-20 DIAGNOSIS — B009 Herpesviral infection, unspecified: Secondary | ICD-10-CM | POA: Diagnosis not present

## 2022-05-20 DIAGNOSIS — L570 Actinic keratosis: Secondary | ICD-10-CM | POA: Diagnosis not present

## 2022-05-20 DIAGNOSIS — Z1283 Encounter for screening for malignant neoplasm of skin: Secondary | ICD-10-CM | POA: Diagnosis not present

## 2022-06-23 ENCOUNTER — Other Ambulatory Visit: Payer: Self-pay | Admitting: Neurology

## 2022-06-23 DIAGNOSIS — G20 Parkinson's disease: Secondary | ICD-10-CM

## 2022-06-28 ENCOUNTER — Other Ambulatory Visit: Payer: Self-pay

## 2022-06-28 ENCOUNTER — Telehealth: Payer: Self-pay | Admitting: Neurology

## 2022-06-28 DIAGNOSIS — G20 Parkinson's disease: Secondary | ICD-10-CM

## 2022-06-28 MED ORDER — CARBIDOPA-LEVODOPA 25-100 MG PO TABS: 1.0000 | ORAL_TABLET | Freq: Four times a day (QID) | ORAL | 0 refills | Status: DC

## 2022-06-28 NOTE — Telephone Encounter (Signed)
1. Which medications need refilled? (List name and dosage, if known) carbidopa-levodopa and pramipexole  2. Which pharmacy/location is medication to be sent to? (include street and city if local pharmacy) [facility]  He went to go pick them up and they didn't have a prescription for one and there was a problem with the other one.

## 2022-06-28 NOTE — Telephone Encounter (Signed)
Called pharmacy Carbidopa levodopa has been sent in and they had to order patients pramipexole

## 2022-07-01 ENCOUNTER — Other Ambulatory Visit: Payer: Self-pay | Admitting: Neurology

## 2022-07-01 DIAGNOSIS — G20 Parkinson's disease: Secondary | ICD-10-CM

## 2022-07-02 ENCOUNTER — Telehealth: Payer: Self-pay | Admitting: Neurology

## 2022-07-02 NOTE — Telephone Encounter (Signed)
Patient found his medication at [facility] and will be ready by 3:00 today

## 2022-07-02 NOTE — Telephone Encounter (Signed)
[facility] in stating [facility]. He needs some today. He is going to try and call around to see if another pharmacy has any.

## 2022-08-23 ENCOUNTER — Other Ambulatory Visit: Payer: Self-pay | Admitting: Neurology

## 2022-08-23 DIAGNOSIS — G20A1 Parkinson's disease without dyskinesia, without mention of fluctuations: Secondary | ICD-10-CM

## 2022-08-23 NOTE — Telephone Encounter (Signed)
1. Which medications need refilled? (List name and dosage, if known) carbidopa levodopa  2. Which pharmacy/location is medication to be sent to? (include street and city if local pharmacy) [facility] [place]  3. Do they need a 30 day or 90 day supply? [place]

## 2022-10-16 NOTE — Progress Notes (Signed)
Assessment/Plan:   1.  Parkinsons Disease  -Long discussion with the patient today that he really needs to take his medication 4 times per day.  He is complaining that he is worse, but often only takes the medication 3 times per day.  He is to take carbidopa/levodopa 25/100, 1 tablet at 6 AM/10 AM/2 PM/6 PM   -Slowly increase pramipexole so that he takes pramipexole 1 mg 3 times per day  -exercise and discussed in detail that he needs to add cardiovascular exercise  -We discussed that it used to be thought that levodopa would increase risk of melanoma but now it is believed that Parkinsons itself likely increases risk of melanoma. he is to get regular skin checks.  He has an appt in a month  -His wife has never been here before and she has several questions and I answered those to the best of my ability.   2.  Sialorrhea  -This is commonly associated with PD.  We talked about treatments.  The patient is not a candidate for oral anticholinergic therapy because of increased risk of confusion and falls.  We discussed Botox (type A and B) and 1% atropine drops.  We discusssed that candy like lemon drops can help by stimulating mm of the oropharynx to induce swallowing.  He would like [place] to get an authorization.     Subjective:   Aaron Robertson was seen today in follow up for Parkinsons disease.  My previous records were reviewed prior to todays visit as well as outside records available to me.  Wife supplements hx.  His levodopa is prescribed 4 times per day, but he often only takes it 3 times per day.  He does take his pramipexole 3 times per day.  He has more trouble getting up/down.  Relies on wife to help him remember to take it but if she's not around he will forget.  He has no compulsive behaviors or sleep attacks.  No hallucinations.  Continues to c/o drooling.    Current prescribed movement disorder medications: Carbidopa/levodopa 25/100 at 6 AM/10 AM/2 PM/6 PM (states he takes 3 times  per day) Pramipexole 0.75 mg 3 times per day    ALLERGIES:  No Known Allergies  CURRENT MEDICATIONS:  Outpatient Encounter Medications as of 10/18/2022  Medication Sig   carbidopa-levodopa (SINEMET IR) 25-100 MG tablet TAKE 1 TABLET BY MOUTH 4 (FOUR) TIMES DAILY. TAKE 1 TABLET AT 6 AM, 10 AM, 2 PM, AND 6 PM DAILY   pramipexole (MIRAPEX) 0.75 MG tablet Take 1 tablet (0.75 mg total) by mouth 4 (four) times daily.   No facility-administered encounter medications on file as of 10/18/2022.    Objective:   PHYSICAL EXAMINATION:    VITALS:   Vitals:   10/18/22 1036  BP: 112/74  Pulse: 73  SpO2: 99%  Weight: 166 lb (75.3 kg)  Height: '5\' 8"'$  (1.727 m)      GEN:  The patient appears stated age and is in NAD. HEENT:  Normocephalic, atraumatic.  The mucous membranes are moist. The superficial temporal arteries are without ropiness or tenderness. CV:  RRR Lungs:  CTAB Neck/HEME:  There are no carotid bruits bilaterally.  Neurological examination:  Orientation: The patient is alert and oriented x3. Cranial nerves: There is good facial symmetry with facial hypomimia. The speech is fluent and clear.  He is hypophonic.  Soft palate rises symmetrically and there is no tongue deviation. Hearing is intact to conversational tone. Sensation: Sensation is  intact to light touch throughout Motor: Strength is at least antigravity x4.  Movement examination: Tone: There is nl tone in the UE/LE Abnormal movements: none today Coordination:  There is no decremation with RAM's, with any form of RAMS, including alternating supination and pronation of the forearm, hand opening and closing, finger taps, heel taps and toe taps. Gait and Station: The patient has no difficulty arising out of a deep-seated chair without the use of the hands. The patient's stride length is somewhat decreased.  He is forward flexed.  He is shuffling a bit.  He has decreased arm swing on the L  I have reviewed and interpreted the  following labs independently    Chemistry      Component Value Date/Time   NA 139 08/06/2018 0820   K 3.9 08/06/2018 0820   CL 100 08/06/2018 0820   CO2 25 08/06/2018 0820   BUN 18 08/06/2018 0820   CREATININE 0.98 08/06/2018 0820      Component Value Date/Time   CALCIUM 9.1 08/06/2018 0820   ALKPHOS 70 08/06/2018 0820   AST 20 08/06/2018 0820   ALT 5 08/06/2018 0820   BILITOT 0.6 08/06/2018 0820       Lab Results  Component Value Date   WBC 4.6 08/06/2018   HGB 14.7 08/06/2018   HCT 42.8 08/06/2018   MCV 92 08/06/2018   PLT 158 08/06/2018    Lab Results  Component Value Date   TSH 3.190 04/02/2016    Total time spent on today's visit was 43 minutes, including both face-to-face time and nonface-to-face time.  Time included that spent on review of records (prior notes available to me/labs/imaging if pertinent), discussing treatment and goals, answering patient's questions and coordinating care.    Cc:  Claretta Fraise, MD

## 2022-10-18 ENCOUNTER — Ambulatory Visit: Payer: Medicare HMO | Admitting: Neurology

## 2022-10-18 ENCOUNTER — Encounter: Payer: Self-pay | Admitting: Neurology

## 2022-10-18 VITALS — BP 112/74 | HR 73 | Ht 68.0 in | Wt 166.0 lb

## 2022-10-18 DIAGNOSIS — K117 Disturbances of salivary secretion: Secondary | ICD-10-CM | POA: Diagnosis not present

## 2022-10-18 DIAGNOSIS — G20A1 Parkinson's disease without dyskinesia, without mention of fluctuations: Secondary | ICD-10-CM | POA: Diagnosis not present

## 2022-10-18 MED ORDER — PRAMIPEXOLE DIHYDROCHLORIDE 1 MG PO TABS: 1.0000 mg | ORAL_TABLET | Freq: Three times a day (TID) | ORAL | 1 refills | Status: DC

## 2022-10-18 NOTE — Patient Instructions (Addendum)
Week 1 Take pramipexole 0.75 mg at 7am, 0.75 mg at noon, 1 mg at 4-5 pm  Week 2 Take pramipexole 0.75 mg at 7am, 1 mg at noon, 1 mg at 4-5 pm  Week 3 Take pramipexole, 1 mg at 7am/noon/4-5 pm  Local and Online Resources for Power over Parkinson's Group  December 2023    LOCAL [place] PARKINSON'S GROUPS   Power over Parkinson's Group:    Power Over Parkinson's Patient Education Group will be Wednesday, December 13th-*Hybrid meting*- in person at [facility] location and via [facility], 2:00-3:00 pm.   Starting in November, Power over [employer] and Care Partner Groups will meet together, with plans for separate break out session for caregivers (*this will be evolving over the next few months) Upcoming Power over Parkinson's Meetings/Care Partner Support:  2nd Wednesdays of the month at 2 pm:   December 13th, January 10th  [place] at [email or web address] if interested in participating in this group    [place] Party!  Wednesday, December 6th, 4:00-5:00 pm.  [facility] and Fitness.  RSVP to Garnetta Buddy at [phone] or [email or web address] New PWR! Moves Dynegy Instructor-Led Classes offering at [employer]!  TUESDAYS and Wednesdays 1-2 pm.   Contact Vonna Kotyk at  [email or web address]  or [phone] (Tuesday classes are modified for chair and standing only) Dance for Parkinson 's classes will be on Tuesdays 9:30am-10:30am starting October 3-December 12 with a break the week of November 21st. Located in the [employer] , in the first floor of the [employer] ([place] To register:  [email or web address] or [phone]  Drumming for Parkinson's will be held on 2nd and 4th Mondays at 11:00 am.   Located at the [place] ([place]  [place] at [email or web address] or  [phone]  Through support from the [place] for Parkinson's classes are free for both patients and caregivers.    [employer] Parkinson's Tai Chi Class, Mondays at 11 am.  Call [phone] for details   [place]:  www.[email or web address]  PD Health at Home continues:  Mindfulness Mondays, Wellness Wednesdays, Fitness Fridays   Upcoming Education:    Eating and Feeling Well through the [place]. Wednesday, Dec. 6th,  1-2 pm  Hospital Safety.  Wednesday, Dec. 13th, 1-2 pm Register for expert briefings (webinars) at [email or web address]  Please check out their website to sign up for emails and see their full online offerings      [place]:  [email or web address]   Third Thursday Webinars:  On the third Thursday of every month at 12 p.m. ET, join our free live webinars to learn about various aspects of living with Parkinson's disease and our work to speed medical breakthroughs.  Upcoming Webinar:  Tools for Diagnosing and Visualizing Parkinson's Disease.  Thursday, December 21st at 12 noon. Check out additional information on their website to see their full online offerings    [facility]:  [email or web address]  Upcoming Webinar:   Stay tuned  Webinar Series:  Living with Parkinson's Meetup.   Third Thursdays each month, 3 pm  Care Partner Monthly Meetup.  With Robin Searing Phinney.  First Tuesday of each month, 2 pm  Check out additional information to Live Well Today on their website    Parkinson and Movement Disorders (PMD) Alliance:  www.[email or web address]  [employer]:  Online Education Events  Sign  up for emails, which are sent weekly to give you updates on programming and online offerings    Parkinson's Association of the [place]:  www.[email or web address]  Information on online support  groups, education events, and online exercises including Yoga, Parkinson's exercises and more-LOTS of information on links to PD resources and online events  Virtual Support Group through Parkinson's Association of the [place]; next one is scheduled for Wednesday, December 6th  at 2 pm.  (These are typically scheduled for the 1st Wednesday of the month at 2 pm).  Visit website for details.   MOVEMENT AND EXERCISE OPPORTUNITIES  PWR! Moves Classes at [place].  Wednesdays 10 and 11 am.   Contact Amy Gerrit Friends, PT [email or web address] if interested.  NEW PWR! Moves Class offerings at [employer].  *TUESDAYS* and Wednesdays 1-2 pm.    Contact Vonna Kotyk at  [email or web address]    Parkinson's Wellness Recovery (PWR! Moves)  www.[email or web address]  Info on the PWR! Virtual Experience:  You will have access to our expertise?through self-assessment, guided plans that start with the PD-specific fundamentals, educational content, tips, Q&A with an expert, and a growing Art therapist of PD-specific pre-recorded and live exercise classes of varying types and intensity - both physical and cognitive! If that is not enough, we offer 1:1 wellness consultations (in-person or virtual) to personalize your PWR! Research scientist (medical).   [place] Fridays:   As part of the PD Health @ Home program, this free video series focuses each week on one aspect of fitness designed to support people living with Parkinson's.? These weekly videos highlight the [place] fitness guidelines for people with Parkinson's disease.  [email or web address]   Dance for PD website is offering free, live-stream classes throughout the week, as well as links to [employer] of classes:  [email or web address]  Virtual dance and Pilates for Parkinson's classes: Click on the Community Tab> Parkinson's Movement Initiative Tab.  To register  for classes and for more information, visit [email or web address] and click the "community" tab.   [employer] Parkinson's Cycling Classes   [employer]:  Thursdays @ Noon-Live classes at [employer] ([employer] at [email or web address]?or [phone])  [employer]: Virtual Classes Mondays and Thursdays Jeanette Caprice classes Tuesday, Wednesday and Thursday (contact [place] at [email or web address] ?or [phone])  [place]  Varied levels of classes are offered Tuesdays and Thursdays at [employer].   Stretching with Verdis Frederickson weekly class is also offered for people with Parkinson's  To observe a class or for more information, call [phone] or email Hezzie Bump at [email or web address]   ADDITIONAL SUPPORT AND RESOURCES  Well[employer]Spring [employer]:Online Caregiver Education Opportunities:  www.well-springsolutions.org/caregiver-education/caregiver-support-group.  You may also contact Vickki Muff at [email or web address] or [phone].     Well-Spring Navigator:  Just1Navigator program, a?free service to help individuals and families through the journey of determining care for older adults.  The "Navigator" is a [phone], Education officer, museum, who will speak with a prospective client and/or loved ones to provide an assessment of the situation and a set of recommendations for a personalized care plan -- all free of charge, and whether?Well[employer]Spring [employer] offers the needed service or not. If the need is not a service we provide, we are well-connected with reputable programs in town that we can refer you to.  www.well-springsolutions.org or to speak with the Navigator, call [phone].

## 2022-10-22 ENCOUNTER — Other Ambulatory Visit (HOSPITAL_COMMUNITY): Payer: Self-pay

## 2022-11-05 ENCOUNTER — Telehealth: Payer: Self-pay | Admitting: Pharmacy Technician

## 2022-11-05 ENCOUNTER — Other Ambulatory Visit (HOSPITAL_COMMUNITY): Payer: Self-pay

## 2022-11-05 NOTE — Telephone Encounter (Unsigned)
Patient Advocate Encounter  Received notification from [facility] that prior authorization for [facility] 5000u is required.   PA submitted on 1.23.24 Key [id number] Status is pending

## 2022-11-06 ENCOUNTER — Other Ambulatory Visit (HOSPITAL_COMMUNITY): Payer: Self-pay

## 2022-11-06 NOTE — Telephone Encounter (Signed)
Patient Advocate Encounter  Received notification from [facility] that prior authorization for [facility] is required.   MEDICAL PA submitted on 1.24.24 Key [id number] Status is pending

## 2022-11-27 ENCOUNTER — Other Ambulatory Visit: Payer: Self-pay

## 2022-11-27 ENCOUNTER — Telehealth: Payer: Self-pay | Admitting: Neurology

## 2022-11-27 MED ORDER — PRAMIPEXOLE DIHYDROCHLORIDE 1 MG PO TABS: 1.0000 mg | ORAL_TABLET | Freq: Three times a day (TID) | ORAL | 0 refills | Status: DC

## 2022-11-27 NOTE — Telephone Encounter (Signed)
1. Which medications need refilled? (List name and dosage, if known) pramipexole  2. Which pharmacy/location is medication to be sent to? (include street and city if local [facility]) [facility]

## 2022-12-02 DIAGNOSIS — R69 Illness, unspecified: Secondary | ICD-10-CM | POA: Diagnosis not present

## 2022-12-16 DIAGNOSIS — H5203 Hypermetropia, bilateral: Secondary | ICD-10-CM | POA: Diagnosis not present

## 2022-12-16 DIAGNOSIS — H524 Presbyopia: Secondary | ICD-10-CM | POA: Diagnosis not present

## 2022-12-23 NOTE — Telephone Encounter (Signed)
Patient Advocate Encounter  Prior Authorization for Myobloc 5000UNIT/ML solution has been approved through [place].    [name]  Effective: 10-14-2022 to 10-14-2023

## 2022-12-24 ENCOUNTER — Other Ambulatory Visit: Payer: Self-pay

## 2022-12-24 ENCOUNTER — Other Ambulatory Visit (HOSPITAL_COMMUNITY): Payer: Self-pay

## 2022-12-24 ENCOUNTER — Telehealth: Payer: Self-pay | Admitting: Neurology

## 2022-12-24 DIAGNOSIS — K117 Disturbances of salivary secretion: Secondary | ICD-10-CM

## 2022-12-24 MED ORDER — MYOBLOC 5000 UNIT/ML IM SOLN
INTRAMUSCULAR | 0 refills | Status: DC
  Filled 2022-12-24: qty 1, 90d supply, fill #0

## 2022-12-24 NOTE — Telephone Encounter (Signed)
Called patient and he was checking on his Myobloc. Medication has been approved and sent to [facility]. I have also sent up front for scheduling

## 2022-12-24 NOTE — Telephone Encounter (Signed)
Patient would like to speak with Dr.[name] about his parkinson's and more treatment

## 2022-12-24 NOTE — Telephone Encounter (Signed)
Called to schedule patient for his Myobloc injection with [name], the patient mailbox was full so we will try back

## 2022-12-25 ENCOUNTER — Other Ambulatory Visit: Payer: Self-pay

## 2022-12-25 ENCOUNTER — Other Ambulatory Visit (HOSPITAL_COMMUNITY): Payer: Self-pay

## 2023-01-07 ENCOUNTER — Other Ambulatory Visit: Payer: Self-pay

## 2023-01-13 ENCOUNTER — Other Ambulatory Visit (HOSPITAL_COMMUNITY): Payer: Self-pay

## 2023-01-17 ENCOUNTER — Other Ambulatory Visit (HOSPITAL_COMMUNITY): Payer: Self-pay

## 2023-01-17 ENCOUNTER — Ambulatory Visit: Payer: Medicare HMO | Admitting: Neurology

## 2023-01-17 DIAGNOSIS — K117 Disturbances of salivary secretion: Secondary | ICD-10-CM

## 2023-01-17 MED ORDER — RIMABOTULINUMTOXINB 5000 UNIT/ML IM SOLN
5000.0000 [IU] | Freq: Once | INTRAMUSCULAR | Status: AC
  Administered 2023-01-17: 5000 [IU] via INTRAMUSCULAR

## 2023-01-17 NOTE — Procedures (Signed)
Botulinum Clinic    History:  Diagnosis: Sialorrhea    Result History  Onset of effect: n/a Duration of Benefit: n/a Adverse Effects: n/a  Consent obtained from: The patient The patient was educated on the botulinum toxin the black blox warning and given a copy of the botox patient medication guide.  The patient understands that this warning states that there have been reported cases of the Botox extending beyond the injection site and creating adverse effects, similar to those of botulism. This included loss of strength, trouble walking, hoarseness, trouble saying words clearly, loss of bladder control, trouble breathing, trouble swallowing, diplopia, blurry vision and ptosis. Most of the distant spread of Botox was happening in patients, primarily children, who received medication for spasticity or for cervical dystonia. The patient expressed understanding and desire to proceed.     Injections  Location Left  Right Units Number of sites  Submandibular gland 250 250 500 1 per side  Parotid 2250 2250 2500 1 per side  TOTAL UNITS:     5000      Type of Toxin: Myobloc type B As ordered and injected IM at today's visit Total Units: 5000  Discarded Units: 0  Needle drawback with each injection was free of blood. Pt tolerated procedure well without complications.   Reinjection is anticipated in 3 months.                     

## 2023-02-13 ENCOUNTER — Telehealth: Payer: Self-pay | Admitting: Neurology

## 2023-02-13 NOTE — Telephone Encounter (Signed)
New message    Pt c/o medication issue:  1. Name of Medication: Botulinum Toxin Type B (MYOBLOC) 5000 UNIT/ML SOLN   2. How are you currently taking this medication (dosage and times per day)? Last Botox injection 01/17/2023   3. Are you having a reaction (difficulty breathing--STAT)? No   4. What is your medication issue?  Couple days after injection thick saliva / sticky down in throat  hard time speaking and swallow.   wife told him to drink more water describe the situation like elmer glue.

## 2023-02-13 NOTE — Telephone Encounter (Signed)
Called patient and gave Dr. Moises Blood recommendations

## 2023-02-14 NOTE — Telephone Encounter (Addendum)
Called patient and gave DR. Tats recommendations  he does want to go to half dose I will change it on appointment line ,and incidentally patient told me he has a visible blood clot in his leg that is moving around the patient said. I advised patient to go to the ER or UC PCP to have this looked at immediately. I expressed how dangerous this is and explained all the things that could happen from a blood clot and it is dangerous. Patient understood and told me he would have it checked out

## 2023-02-24 DIAGNOSIS — M25561 Pain in right knee: Secondary | ICD-10-CM | POA: Diagnosis not present

## 2023-02-24 DIAGNOSIS — M545 Low back pain, unspecified: Secondary | ICD-10-CM | POA: Diagnosis not present

## 2023-03-19 ENCOUNTER — Other Ambulatory Visit (HOSPITAL_COMMUNITY): Payer: Self-pay

## 2023-03-29 ENCOUNTER — Other Ambulatory Visit: Payer: Self-pay | Admitting: Neurology

## 2023-03-29 DIAGNOSIS — G20A1 Parkinson's disease without dyskinesia, without mention of fluctuations: Secondary | ICD-10-CM

## 2023-04-01 ENCOUNTER — Telehealth: Payer: Self-pay | Admitting: Neurology

## 2023-04-01 ENCOUNTER — Other Ambulatory Visit (HOSPITAL_COMMUNITY): Payer: Self-pay

## 2023-04-01 NOTE — Telephone Encounter (Signed)
Patient canceled the the myobloc appt for 04-18-23. He states at this time he does not want to cont with myobloc injection. He would like to speak with someone about this. Please call

## 2023-04-01 NOTE — Telephone Encounter (Signed)
Called patient and left message that I would let Dr. Arbutus Leas know that patient wants to quit injection

## 2023-04-18 ENCOUNTER — Ambulatory Visit: Payer: Medicare HMO | Admitting: Neurology

## 2023-05-02 NOTE — Progress Notes (Unsigned)
Assessment/Plan:   1.  Parkinsons Disease  -Long discussion with the patient today that he really needs to take his medication 4 times per day.  He is complaining that he is worse, but often only takes the medication 3 times per day.  He is to take carbidopa/levodopa 25/100, 1 tablet at 6 AM/10 AM/2 PM/6 PM   -Continue pramipexole 1 mg 3 times per day  -exercise and discussed in detail that he needs to add cardiovascular exercise  -He is following regularly with dermatology.  2.  Sialorrhea  -He tried Myobloc 1 time and felt that his mouth was too dry.  We offered to drop the dose in half the next time, but he declined and wanted to stop.   Subjective:   Aaron Robertson was seen today in follow up for Parkinsons disease.  My previous records were reviewed prior to todays visit as well as outside records available to me.  Wife supplements hx. last visit, I had a long discussion with the patient about taking his medication 4 times per day.  He was complaining that he was worse, but only taking the medication 3 times per day.  We also increased his pramipexole.  Today, he reports that ***.  He has not had compulsive behaviors or sleep attacks.  He did his first series of Botox injections for drooling on April 5.  He called about a month later to state that his saliva was thick and mouth was dry.  He did not want to continue the Myobloc, even at a lower dosage.  Biotene was encouraged.  Current prescribed movement disorder medications: Carbidopa/levodopa 25/100 at 6 AM/10 AM/2 PM/6 PM  Pramipexole 1 mg 3 times per day    ALLERGIES:  No Known Allergies  CURRENT MEDICATIONS:  Outpatient Encounter Medications as of 05/06/2023  Medication Sig   Botulinum Toxin Type B (MYOBLOC) 5000 UNIT/ML SOLN Inject 5000 units into head and neck every 120 days by the Dr. at the office   carbidopa-levodopa (SINEMET IR) 25-100 MG tablet TAKE 1 TABLET BY MOUTH 4 (FOUR) TIMES DAILY. TAKE 1 TABLET AT 6 AM, 10  AM, 2 PM, AND 6 PM DAILY   pramipexole (MIRAPEX) 1 MG tablet TAKE 1 TABLET BY MOUTH 3 TIMES DAILY.   No facility-administered encounter medications on file as of 05/06/2023.    Objective:   PHYSICAL EXAMINATION:    VITALS:   There were no vitals filed for this visit.     GEN:  The patient appears stated age and is in NAD. HEENT:  Normocephalic, atraumatic.  The mucous membranes are moist. The superficial temporal arteries are without ropiness or tenderness. CV:  RRR Lungs:  CTAB Neck/HEME:  There are no carotid bruits bilaterally.  Neurological examination:  Orientation: The patient is alert and oriented x3. Cranial nerves: There is good facial symmetry with facial hypomimia. The speech is fluent and clear.  He is hypophonic.  Soft palate rises symmetrically and there is no tongue deviation. Hearing is intact to conversational tone. Sensation: Sensation is intact to light touch throughout Motor: Strength is at least antigravity x4.  Movement examination: Tone: There is nl tone in the UE/LE Abnormal movements: none today Coordination:  There is no decremation with RAM's, with any form of RAMS, including alternating supination and pronation of the forearm, hand opening and closing, finger taps, heel taps and toe taps. Gait and Station: The patient has no difficulty arising out of a deep-seated chair without the use of  the hands. The patient's stride length is somewhat decreased.  He is forward flexed.  He is shuffling a bit.  He has decreased arm swing on the L  I have reviewed and interpreted the following labs independently    Chemistry      Component Value Date/Time   NA 139 08/06/2018 0820   K 3.9 08/06/2018 0820   CL 100 08/06/2018 0820   CO2 25 08/06/2018 0820   BUN 18 08/06/2018 0820   CREATININE 0.98 08/06/2018 0820      Component Value Date/Time   CALCIUM 9.1 08/06/2018 0820   ALKPHOS 70 08/06/2018 0820   AST 20 08/06/2018 0820   ALT 5 08/06/2018 0820    BILITOT 0.6 08/06/2018 0820       Lab Results  Component Value Date   WBC 4.6 08/06/2018   HGB 14.7 08/06/2018   HCT 42.8 08/06/2018   MCV 92 08/06/2018   PLT 158 08/06/2018    Lab Results  Component Value Date   TSH 3.190 04/02/2016    Total time spent on today's visit was *** minutes, including both face-to-face time and nonface-to-face time.  Time included that spent on review of records (prior notes available to me/labs/imaging if pertinent), discussing treatment and goals, answering patient's questions and coordinating care.    Cc:  Mechele Claude, MD

## 2023-05-06 ENCOUNTER — Ambulatory Visit: Payer: Medicare HMO | Admitting: Neurology

## 2023-05-06 ENCOUNTER — Other Ambulatory Visit (INDEPENDENT_AMBULATORY_CARE_PROVIDER_SITE_OTHER): Payer: Medicare HMO

## 2023-05-06 ENCOUNTER — Encounter: Payer: Self-pay | Admitting: Neurology

## 2023-05-06 VITALS — BP 112/62 | HR 62 | Ht 68.0 in | Wt 160.4 lb

## 2023-05-06 DIAGNOSIS — R6889 Other general symptoms and signs: Secondary | ICD-10-CM

## 2023-05-06 DIAGNOSIS — G4700 Insomnia, unspecified: Secondary | ICD-10-CM

## 2023-05-06 DIAGNOSIS — K117 Disturbances of salivary secretion: Secondary | ICD-10-CM

## 2023-05-06 DIAGNOSIS — Z5181 Encounter for therapeutic drug level monitoring: Secondary | ICD-10-CM | POA: Diagnosis not present

## 2023-05-06 DIAGNOSIS — G20A1 Parkinson's disease without dyskinesia, without mention of fluctuations: Secondary | ICD-10-CM

## 2023-05-06 DIAGNOSIS — R351 Nocturia: Secondary | ICD-10-CM | POA: Diagnosis not present

## 2023-05-06 LAB — COMPREHENSIVE METABOLIC PANEL
ALT: 3 U/L (ref 0–53)
AST: 18 U/L (ref 0–37)
Albumin: 4.2 g/dL (ref 3.5–5.2)
Alkaline Phosphatase: 76 U/L (ref 39–117)
BUN: 27 mg/dL — ABNORMAL HIGH (ref 6–23)
CO2: 32 mEq/L (ref 19–32)
Calcium: 9.4 mg/dL (ref 8.4–10.5)
Chloride: 101 mEq/L (ref 96–112)
Creatinine, Ser: 0.84 mg/dL (ref 0.40–1.50)
GFR: 90.07 mL/min (ref >60.00)
Glucose, Bld: 103 mg/dL — ABNORMAL HIGH (ref 70–99)
Potassium: 4.2 mEq/L (ref 3.5–5.1)
Sodium: 137 mEq/L (ref 135–145)
Total Bilirubin: 0.7 mg/dL (ref 0.2–1.2)
Total Protein: 7.2 g/dL (ref 6.0–8.3)

## 2023-05-06 LAB — CBC WITH DIFFERENTIAL/PLATELET
Basophils Absolute: 0 10*3/uL (ref 0.0–0.1)
Basophils Relative: 0.7 % (ref 0.0–3.0)
Eosinophils Absolute: 0.1 10*3/uL (ref 0.0–0.7)
Eosinophils Relative: 2.3 % (ref 0.0–5.0)
HCT: 43.2 % (ref 39.0–52.0)
Hemoglobin: 14.1 g/dL (ref 13.0–17.0)
Lymphocytes Relative: 29.2 % (ref 12.0–46.0)
Lymphs Abs: 1.3 10*3/uL (ref 0.7–4.0)
MCHC: 32.5 g/dL (ref 30.0–36.0)
MCV: 94.9 fl (ref 78.0–100.0)
Monocytes Absolute: 0.4 10*3/uL (ref 0.1–1.0)
Monocytes Relative: 9.2 % (ref 3.0–12.0)
Neutro Abs: 2.5 10*3/uL (ref 1.4–7.7)
Neutrophils Relative %: 58.6 % (ref 43.0–77.0)
Platelets: 169 10*3/uL (ref 150.0–400.0)
RBC: 4.56 Mil/uL (ref 4.22–5.81)
RDW: 14 % (ref 11.5–15.5)
WBC: 4.3 10*3/uL (ref 4.0–10.5)

## 2023-05-06 LAB — VITAMIN B12: Vitamin B-12: 340 pg/mL (ref 211–911)

## 2023-05-06 MED ORDER — PRAMIPEXOLE DIHYDROCHLORIDE 1 MG PO TABS: 1.0000 mg | ORAL_TABLET | Freq: Three times a day (TID) | ORAL | 0 refills | Status: DC

## 2023-05-06 NOTE — Addendum Note (Signed)
Addended by: Barnet Glasgow on: 05/06/2023 09:29 AM   Modules accepted: Orders

## 2023-05-06 NOTE — Patient Instructions (Signed)
We will look into authorization for the new botox called xeomin You need to get a primary care physician  Your provider has requested that you have labwork completed today. The lab is located on the Second floor at [address], within the [facility] Endocrinology office. When you get off the elevator, turn right and go in the [facility] [address]; the first brown door on the left.  Tell the ladies behind the desk that you are there for lab work. If you are not called within 15 minutes please check with the front desk.   Once you complete your labs you are free to go. You will receive a call or message via MyChart with your lab results.

## 2023-05-07 ENCOUNTER — Other Ambulatory Visit (HOSPITAL_COMMUNITY): Payer: Self-pay

## 2023-05-07 ENCOUNTER — Telehealth: Payer: Self-pay | Admitting: Pharmacy Technician

## 2023-05-07 NOTE — Telephone Encounter (Signed)
Pharmacy Patient Advocate Encounter- XEOMIN-Medical Benefit:  Buy/Bill J code: [id number] Dx Code: K11.7  PA was submitted to 7.24.24 and has been approved through: 7.26.24 - 12.31.24 Authorization# [id number]  Please send prescription to Specialty Pharmacy:  BUY/BILL

## 2023-05-07 NOTE — Telephone Encounter (Signed)
Pharmacy Patient Advocate Encounter   Received notification from Physician's Office that prior authorization for XEOMIN 100 is required/requested.   Insurance verification completed.   The patient is insured through [place] .   Per test claim: PA submitted to [facility] via CoverMyMeds Key/confirmation #/EOC [facility] Status is pending

## 2023-05-12 ENCOUNTER — Telehealth: Payer: Self-pay | Admitting: Neurology

## 2023-05-12 NOTE — Telephone Encounter (Signed)
Called to sch patient for xeomine injection with [name] No answer and could not leave a message will try back

## 2023-05-21 DIAGNOSIS — D485 Neoplasm of uncertain behavior of skin: Secondary | ICD-10-CM | POA: Diagnosis not present

## 2023-05-21 DIAGNOSIS — L570 Actinic keratosis: Secondary | ICD-10-CM | POA: Diagnosis not present

## 2023-05-21 DIAGNOSIS — B009 Herpesviral infection, unspecified: Secondary | ICD-10-CM | POA: Diagnosis not present

## 2023-05-21 DIAGNOSIS — Z1283 Encounter for screening for malignant neoplasm of skin: Secondary | ICD-10-CM | POA: Diagnosis not present

## 2023-05-23 ENCOUNTER — Ambulatory Visit: Payer: Medicare HMO | Admitting: Neurology

## 2023-05-23 DIAGNOSIS — K117 Disturbances of salivary secretion: Secondary | ICD-10-CM | POA: Diagnosis not present

## 2023-05-23 MED ORDER — INCOBOTULINUMTOXINA 100 UNITS IM SOLR
100.0000 [IU] | INTRAMUSCULAR | Status: AC
Start: 2023-05-23 — End: ?
  Administered 2023-05-23: 100 [IU] via INTRAMUSCULAR

## 2023-05-23 NOTE — Procedures (Signed)
Botulinum Clinic   History:  Diagnosis: Sialorrhea associated with PD (icd10: K11.7)    Result History  Changed to xeomin due to extreme dryness with myobloc.  However, now having sialorrhea again  Consent obtained from: The patient  The patient was educated on the botulinum toxin the black blox warning and given a copy of the botox patient medication guide.  The patient understands that this warning states that there have been reported cases of the Botox extending beyond the injection site and creating adverse effects, similar to those of botulism. This included loss of strength, trouble walking, hoarseness, trouble saying words clearly, loss of bladder control, trouble breathing, trouble swallowing, diplopia, blurry vision and ptosis. Most of the distant spread of Botox was happening in patients, primarily children, who received medication for spasticity or for cervical dystonia. The patient expressed understanding and desire to proceed.   Injections  Location Left  Right Units Number of sites  Parotid (point 1 on picture) 30 30 60 1 per side  Submandibular (point 2) 20 20 40 1 per side  TOTAL UNITS:   100    Type of Toxin: Xeomin Discarded Units: 0  Needle drawback with each injection was free of blood. Pt tolerated procedure well without complications.   Reinjection is anticipated in 4 months.

## 2023-06-10 ENCOUNTER — Telehealth: Payer: Self-pay | Admitting: Neurology

## 2023-06-10 NOTE — Telephone Encounter (Signed)
Patients sister is not on DPR patients sister is POA but that does not kick in until patient is deemed incompetent or unable to make decisions for him or her self. I called patient and patients wife who are both on DPR to explain this process and see if I can answer any questions for the patient at this time

## 2023-06-10 NOTE — Telephone Encounter (Signed)
Called patient back.

## 2023-06-10 NOTE — Telephone Encounter (Signed)
Patient missed your call and tried to call back

## 2023-06-10 NOTE — Telephone Encounter (Signed)
Pt's sister [facility]) called needing a call back from nurse, she has questions and would like to go over patients care [phone] Her name is Micael Hampshire

## 2023-06-11 NOTE — Telephone Encounter (Signed)
Called and spoke to patients sister, patient is not taking his medication regularly, patient is hallucinating at day and night while awake . Patient [place]. Patient is not willing to do what is needed but patients sister very concerned about his behavior and that he is has regressed over the last three months

## 2023-06-11 NOTE — Telephone Encounter (Signed)
Pt sister is returning the call to [facility] and would like to have a call back.

## 2023-06-11 NOTE — Telephone Encounter (Signed)
Pt sister was returning a phone call.

## 2023-06-11 NOTE — Progress Notes (Unsigned)
New Patient Office Visit  Subjective    Patient ID: Aaron Robertson, male    DOB: November 17, 1955  Age: 67 y.o. MRN: [id number]  CC: No chief complaint on file.   HPI Aaron Robertson presents to establish care with new provider.   Patients previous primary care provider was   Specialist: Aaron Robertson with Dr. Flonnie [facility] with Dr. Marcelino Robertson  [facility] with Dr. Ranee Robertson.   Patient was seen on 05/06/2023 by Dr. Elvera Robertson. [name] with [facility]. He was advised to find a PCP with concerns about his insomnia and a urologist for nocturia. CBC, CMP, and Vitamin B12 were completed with on recommendations of taking an OTC Vitamin B12 supplement for a vit B12 level of 340.  Outpatient Encounter Medications as of 06/12/2023  Medication Sig   Botulinum Toxin Type B (MYOBLOC) 5000 UNIT/ML SOLN Inject 5000 units into head and neck every 120 days by the Dr. at the office   carbidopa-levodopa (SINEMET IR) 25-100 MG tablet TAKE 1 TABLET BY MOUTH 4 (FOUR) TIMES DAILY. TAKE 1 TABLET AT 6 AM, 10 AM, 2 PM, AND 6 PM DAILY   pramipexole (MIRAPEX) 1 MG tablet Take 1 tablet (1 mg total) by mouth 3 (three) times daily.   Facility-Administered Encounter Medications as of 06/12/2023  Medication   incobotulinumtoxinA (XEOMIN) 100 units injection 100 Units    Past Medical History:  Diagnosis Date   Asbestosis (HCC)    Inguinal hernia 04/2015   right   Neuromuscular disorder (HCC)    Parkinson's disease   Parkinson's disease     Past Surgical History:  Procedure Laterality Date   COLONOSCOPY     INGUINAL HERNIA REPAIR Right 05/15/2015   Procedure: OPEN RIGHT INGUINAL HERNIA REPAIR ;  Surgeon: Claud Kelp, MD;  Location: [facility];  Service: General;  Laterality: Right;   INSERTION OF MESH Right 05/15/2015   Procedure: INSERTION OF MESH;  Surgeon: Claud Kelp, MD;  Location: [facility];  Service:  General;  Laterality: Right;    Family History  Problem Relation Age of Onset   Hearing loss Mother    Arrhythmia Mother    Cancer Maternal Uncle        unknown   Stroke Father 5   Hypertension Father    Alzheimer's disease Father    Seizures Father 37   Hyperlipidemia Sister    Hyperlipidemia Sister    Healthy Son    Healthy Daughter    Stroke Paternal Grandfather 35    Social History   Socioeconomic History   Marital status: Married    Spouse name: Not on file   Number of children: 2   Years of education: Not on file   Highest education level: Some college, no degree  Occupational History   Not on file  Tobacco Use   Smoking status: Never   Smokeless tobacco: Never  Vaping Use   Vaping status: Never Used  Substance and Sexual Activity   Alcohol use: Not Currently    Comment: occasionally   Drug use: No   Sexual activity: Not on file  Other Topics Concern   Not on file  Social History Narrative   Pt lives at private home with [facility], has 2 children   Right handed   Drinks tea, no coffee, no soda   Social Determinants of Health   Financial Resource Strain: Not on file  Food Insecurity: Not  on file  Transportation Needs: Not on file  Physical Activity: Not on file  Stress: Not on file  Social Connections: Unknown (02/28/2022)   Received from [facility]   Social Network    Social Network: Not on file  Intimate Partner Violence: Unknown (02/28/2022)   Received from [facility]   HITS    Physically Hurt: Not on file    Insult or Talk Down To: Not on file    Threaten Physical Harm: Not on file    Scream or Curse: Not on file    ROS See HPI above    Objective    There were no vitals taken for this visit.  Physical Exam    Assessment & Plan:  There are no diagnoses linked to this encounter. 1.Review of health maintenance: -Colonoscopy/cologuard:  -Covid vaccine/booster: -AWV: Needs visit -PNA vaccine: -Zoster vaccine:   -Influenza vaccine:  No follow-ups on file.   Zandra Abts, NP

## 2023-06-11 NOTE — Telephone Encounter (Signed)
Pt called in and stated that he would like to have a call back.

## 2023-06-11 NOTE — Telephone Encounter (Signed)
Pt sister called back this morning, pt is having hallucinations. Patient has been seeing people in his house , he has called his sister multiple times. POA is wanting to know what to do, if he needs a brain scan or different medications. After hours nurse told them to take him to the ER and call 911.

## 2023-06-11 NOTE — Telephone Encounter (Signed)
Talked with patients sister . She stated they have no idea how many or if he is even taking his medication especially the pramipexole. She is getting patient a visit with PCP and has had to make the decision to take more control of the patients finances medication driving. She is very concerned about her brother and that he is not taking care of himself

## 2023-06-11 NOTE — Telephone Encounter (Signed)
Called patients sister and was disconnected called back went straight to voicemail and I left a detailed message asking for a return call

## 2023-06-11 NOTE — Telephone Encounter (Signed)
Left message with after hour service on 06-10-23 at 5:02 pm   Caller states that Patient has been Hallucinations- SX started before 04-17-24 to thought he was seeing PPL and animals not present. Denies HX of hallucinations. DX with Parkinson dis-occurred again last night was seeing person in home and saw people fading in to the walls. In the past 3 weeks pt is making unwise choices brought a truck online without seeing it. Pt states sx started about 1 month ago has not spoken to provider about the hallucinations  Denies having them prior to a month ago   At 5:44 pm  was told to call EMS  At 5:44 pm 911 outcome documentation  Pt refuses 911 or to go to the ED

## 2023-06-12 ENCOUNTER — Encounter: Payer: Self-pay | Admitting: Family Medicine

## 2023-06-12 ENCOUNTER — Ambulatory Visit (INDEPENDENT_AMBULATORY_CARE_PROVIDER_SITE_OTHER): Payer: Medicare HMO | Admitting: Family Medicine

## 2023-06-12 ENCOUNTER — Telehealth: Payer: Self-pay | Admitting: Neurology

## 2023-06-12 VITALS — BP 92/60 | HR 79 | Temp 98.7°F | Ht 68.0 in | Wt 158.1 lb

## 2023-06-12 DIAGNOSIS — Z125 Encounter for screening for malignant neoplasm of prostate: Secondary | ICD-10-CM | POA: Diagnosis not present

## 2023-06-12 DIAGNOSIS — Z7689 Persons encountering health services in other specified circumstances: Secondary | ICD-10-CM

## 2023-06-12 DIAGNOSIS — R443 Hallucinations, unspecified: Secondary | ICD-10-CM | POA: Diagnosis not present

## 2023-06-12 DIAGNOSIS — G4700 Insomnia, unspecified: Secondary | ICD-10-CM | POA: Diagnosis not present

## 2023-06-12 DIAGNOSIS — R351 Nocturia: Secondary | ICD-10-CM

## 2023-06-12 LAB — POCT URINALYSIS DIPSTICK
Bilirubin, UA: NEGATIVE
Blood, UA: NEGATIVE
Glucose, UA: NEGATIVE
Ketones, UA: NEGATIVE
Leukocytes, UA: NEGATIVE
Nitrite, UA: NEGATIVE
Protein, UA: NEGATIVE
Spec Grav, UA: 1.02 (ref 1.010–1.025)
Urobilinogen, UA: 0.2 E.U./dL
pH, UA: 6 (ref 5.0–8.0)

## 2023-06-12 LAB — COMPREHENSIVE METABOLIC PANEL
ALT: 9 U/L (ref 0–53)
AST: 20 U/L (ref 0–37)
Albumin: 4 g/dL (ref 3.5–5.2)
Alkaline Phosphatase: 75 U/L (ref 39–117)
BUN: 29 mg/dL — ABNORMAL HIGH (ref 6–23)
CO2: 31 meq/L (ref 19–32)
Calcium: 9.2 mg/dL (ref 8.4–10.5)
Chloride: 100 meq/L (ref 96–112)
Creatinine, Ser: 0.99 mg/dL (ref 0.40–1.50)
GFR: 78.62 mL/min (ref >60.00)
Glucose, Bld: 91 mg/dL (ref 70–99)
Potassium: 4.3 meq/L (ref 3.5–5.1)
Sodium: 138 meq/L (ref 135–145)
Total Bilirubin: 0.7 mg/dL (ref 0.2–1.2)
Total Protein: 6.6 g/dL (ref 6.0–8.3)

## 2023-06-12 LAB — CBC WITH DIFFERENTIAL/PLATELET
Basophils Absolute: 0 10*3/uL (ref 0.0–0.1)
Basophils Relative: 0.6 % (ref 0.0–3.0)
Eosinophils Absolute: 0 10*3/uL (ref 0.0–0.7)
Eosinophils Relative: 0.7 % (ref 0.0–5.0)
HCT: 43.2 % (ref 39.0–52.0)
Hemoglobin: 14 g/dL (ref 13.0–17.0)
Lymphocytes Relative: 26.7 % (ref 12.0–46.0)
Lymphs Abs: 1.6 10*3/uL (ref 0.7–4.0)
MCHC: 32.5 g/dL (ref 30.0–36.0)
MCV: 95.5 fl (ref 78.0–100.0)
Monocytes Absolute: 0.6 10*3/uL (ref 0.1–1.0)
Monocytes Relative: 9.1 % (ref 3.0–12.0)
Neutro Abs: 3.8 10*3/uL (ref 1.4–7.7)
Neutrophils Relative %: 62.9 % (ref 43.0–77.0)
Platelets: 164 10*3/uL (ref 150.0–400.0)
RBC: 4.52 Mil/uL (ref 4.22–5.81)
RDW: 13.4 % (ref 11.5–15.5)
WBC: 6 10*3/uL (ref 4.0–10.5)

## 2023-06-12 NOTE — Telephone Encounter (Signed)
Patients POA (sister) called stating that the patient to his PCP , did a urinalysis and it was neg also did a blood test, waiting on results

## 2023-06-12 NOTE — Patient Instructions (Signed)
It was pleasure to meet you today and look forward to taking care of you.  -Urinalysis is normal, but will send for a culture to confirm.  -Ordered labs for screening and reason for symptoms. Office will call with lab results and you may see them in MyChart.  -Will be in touch with neurologist after obtaining labs results.  -Follow up in 3 months for a physical and please make an appointment for your Annual Wellness Exam with Raynelle Fanning, LPN over the phone.

## 2023-06-13 ENCOUNTER — Telehealth: Payer: Self-pay | Admitting: Neurology

## 2023-06-13 ENCOUNTER — Other Ambulatory Visit: Payer: Self-pay

## 2023-06-13 ENCOUNTER — Telehealth: Payer: Self-pay

## 2023-06-13 DIAGNOSIS — G20A1 Parkinson's disease without dyskinesia, without mention of fluctuations: Secondary | ICD-10-CM

## 2023-06-13 LAB — URINE CULTURE
MICRO NUMBER:: 15400078
Result:: NO GROWTH
SPECIMEN QUALITY:: ADEQUATE

## 2023-06-13 LAB — TSH: TSH: 1.96 u[IU]/mL (ref 0.35–5.50)

## 2023-06-13 LAB — VITAMIN D 25 HYDROXY (VIT D DEFICIENCY, FRACTURES): VITD: 41.02 ng/mL (ref 30.00–100.00)

## 2023-06-13 LAB — PSA: PSA: 0.48 ng/mL (ref 0.10–4.00)

## 2023-06-13 MED ORDER — PRAMIPEXOLE DIHYDROCHLORIDE 0.5 MG PO TABS: 0.5000 mg | ORAL_TABLET | Freq: Three times a day (TID) | ORAL | 1 refills | Status: DC

## 2023-06-13 NOTE — Telephone Encounter (Signed)
-----   Message from Zandra Abts sent at 06/13/2023 12:27 PM EDT ----- Labs look stable. No concern. I am sending a copy to neurologist.

## 2023-06-13 NOTE — Telephone Encounter (Signed)
Called patients sister and told her about the decrease in pramipexole and referrals getting sent

## 2023-06-13 NOTE — Telephone Encounter (Signed)
Sister called, pt had another rough evening last night. And is calling to see if they could get another medication. And would like a call back

## 2023-06-13 NOTE — Telephone Encounter (Signed)
Certainly his pramipexole could be causing some of the behaviors but she told us she didn't know if he was taking it or how he was taking it.  Part of the issue is that his sister has never been involved until this last visit.  Decrease pramipexole to 0.5 mg three times per day.  We may need to d/c all together but cannot just d/c that fast.  Someone needs to monitor and give his meds.  Unfortunately, they are expecting [place] to be responsible for his him at home, while leaving him alone at home all the time.  They need to make sure that someone is with him and administering his medications.  His sister expressed to chelsea that she wanted a bid medication but unfortunately levodopa doesn't come that way but she felt that another physician would have an answer to that.  Happy to send him for another opinion although getting him off of pramipexole is the first step and getting him more care at home with someone watching him is also the first step.  I sent in lower dose of pramipexole.  Let them know the above

## 2023-06-13 NOTE — Telephone Encounter (Signed)
Called patients sister Olegario Messier back and she stated patient is seeing people in his house, he left the horse gate open and a very expensive horse got out. Patients sister stated patient has been taking his medication for the last two days because his wife has been home with him to make sure he took it. She still doesn't know how much medication patient has taken in the past. Pateint sister very upset and wanted to get referrals to another doctor do to the fact that she believes that patient should have his medication adjusted despite the fact she doesn't know what and when he is taking his medication. Patients sister expecting Dr. Arbutus Leas to do more for this patient . I mentioned that he needed more care at home or a facility she refused even the thought of it and stated patient would rather kill himself than have a nurse. She requested referrals to [facility] and [facility]

## 2023-06-17 ENCOUNTER — Telehealth: Payer: Self-pay

## 2023-06-17 ENCOUNTER — Telehealth: Payer: Self-pay | Admitting: Neurology

## 2023-06-17 NOTE — Telephone Encounter (Signed)
Pt has been notified.

## 2023-06-17 NOTE — Telephone Encounter (Signed)
Pts sister is calling in needing clarification on a medication that the pt picked up over the weekend and it has a refills on it and they were under the understanding that the pt was to be weaning off of the medication.  Sister would like to have a call back to let them know.

## 2023-06-17 NOTE — Telephone Encounter (Signed)
-----   Message from Zandra Abts sent at 06/17/2023  8:13 AM EDT ----- There was no growth found on your urine culture.

## 2023-06-17 NOTE — Telephone Encounter (Signed)
Dr. Arbutus Leas is there na exact weaning schedule I can give patients sister over a period of time so she will be aware of the decline in meds

## 2023-06-17 NOTE — Telephone Encounter (Signed)
Talitha Givens (sister) called back [phone]

## 2023-06-18 ENCOUNTER — Ambulatory Visit: Payer: Medicare HMO | Admitting: Family Medicine

## 2023-06-18 NOTE — Telephone Encounter (Signed)
Called and explained decrease in medication and sister understood and stated patient has not seen any hallucinations at this time

## 2023-06-23 ENCOUNTER — Telehealth: Payer: Self-pay | Admitting: Neurology

## 2023-06-23 ENCOUNTER — Other Ambulatory Visit: Payer: Self-pay

## 2023-06-23 DIAGNOSIS — R2689 Other abnormalities of gait and mobility: Secondary | ICD-10-CM

## 2023-06-23 DIAGNOSIS — G20A1 Parkinson's disease without dyskinesia, without mention of fluctuations: Secondary | ICD-10-CM

## 2023-06-23 DIAGNOSIS — T699XXS Effect of reduced temperature, unspecified, sequela: Secondary | ICD-10-CM

## 2023-06-23 NOTE — Telephone Encounter (Signed)
Patient called wanting a call back. Pt thinks when he turns his body or looks in the direction , his body doesn't turn.

## 2023-06-23 NOTE — Telephone Encounter (Signed)
Called pateint sent in order for PT patient wants a pill to fix it I informed him that there are not any pills like this

## 2023-06-24 ENCOUNTER — Telehealth: Payer: Self-pay | Admitting: Neurology

## 2023-06-24 NOTE — Telephone Encounter (Signed)
Pt called in stating since he had his xeomen injection the fluid coming out of his chin has gotten worse. He says the fluid is sticky. He is wondering if he needs to wait longer for the xeomen to work or if it is making it worse?

## 2023-06-25 NOTE — Telephone Encounter (Signed)
Explained to patient that Dr. Arbutus Leas recommends to stop the xeomin injections

## 2023-07-02 ENCOUNTER — Ambulatory Visit (INDEPENDENT_AMBULATORY_CARE_PROVIDER_SITE_OTHER): Payer: Medicare HMO | Admitting: *Deleted

## 2023-07-02 DIAGNOSIS — Z0000 Encounter for general adult medical examination without abnormal findings: Secondary | ICD-10-CM | POA: Diagnosis not present
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
## 2023-07-02 NOTE — Patient Instructions (Signed)
[name] , Thank you for taking time to come for your Medicare Wellness Visit. I appreciate your ongoing commitment to your health goals. Please review the following plan we discussed and let me know if I can assist you in the future.   Screening recommendations/referrals: Colonoscopy: Education provided Recommended yearly ophthalmology/optometry visit for glaucoma screening and checkup Recommended yearly dental visit for hygiene and checkup  Vaccinations: Influenza vaccine: Education provided Pneumococcal vaccine: Education provided Tdap vaccine: up to date Shingles vaccine: Education provided    Advanced directives: yes not on file    Preventive Care 65 Years and Older, Male Preventive care refers to lifestyle choices and visits with your health care provider that can promote health and wellness. What does preventive care include? A yearly physical exam. This is also called an annual well check. Dental exams once or twice a year. Routine eye exams. Ask your health care provider how often you should have your eyes checked. Personal lifestyle choices, including: Daily care of your teeth and gums. Regular physical activity. Eating a healthy diet. Avoiding tobacco and drug use. Limiting alcohol use. Practicing safe sex. Taking low doses of aspirin every day. Taking vitamin and mineral supplements as recommended by your health care provider. What happens during an annual well check? The services and screenings done by your health care provider during your annual well check will depend on your age, overall health, lifestyle risk factors, and family history of disease. Counseling  Your health care provider may ask you questions about your: Alcohol use. Tobacco use. Drug use. Emotional well-being. Home and relationship well-being. Sexual activity. Eating habits. History of falls. Memory and ability to understand (cognition). Work and work Astronomer. Screening  You may  have the following tests or measurements: Height, weight, and BMI. Blood pressure. Lipid and cholesterol levels. These may be checked every 5 years, or more frequently if you are over 54 years old. Skin check. Lung cancer screening. You may have this screening every year starting at age 83 if you have a 30-pack-year history of smoking and currently smoke or have quit within the past 15 years. Fecal occult blood test (FOBT) of the stool. You may have this test every year starting at age 71. Flexible sigmoidoscopy or colonoscopy. You may have a sigmoidoscopy every 5 years or a colonoscopy every 10 years starting at age 77. Prostate cancer screening. Recommendations will vary depending on your family history and other risks. Hepatitis C blood test. Hepatitis B blood test. Sexually transmitted disease (STD) testing. Diabetes screening. This is done by checking your blood sugar (glucose) after you have not eaten for a while (fasting). You may have this done every 1-3 years. Abdominal aortic aneurysm (AAA) screening. You may need this if you are a current or former smoker. Osteoporosis. You may be screened starting at age 102 if you are at high risk. Talk with your health care provider about your test results, treatment options, and if necessary, the need for more tests. Vaccines  Your health care provider may recommend certain vaccines, such as: Influenza vaccine. This is recommended every year. Tetanus, diphtheria, and acellular pertussis (Tdap, Td) vaccine. You may need a Td booster every 10 years. Zoster vaccine. You may need this after age 25. Pneumococcal 13-valent conjugate (PCV13) vaccine. One dose is recommended after age 52. Pneumococcal polysaccharide (PPSV23) vaccine. One dose is recommended after age 44. Talk to your health care provider about which screenings and vaccines you need and how often you need them. This information  is not intended to replace advice given to you by your  health care provider. Make sure you discuss any questions you have with your health care provider. Document Released: 10/27/2015 Document Revised: 06/19/2016 Document Reviewed: 08/01/2015 Elsevier Interactive Patient Education  2017 [employer].  Fall Prevention in the Home Falls can cause injuries. They can happen to people of all ages. There are many things you can do to make your home safe and to help prevent falls. What can I do on the outside of my home? Regularly fix the edges of walkways and driveways and fix any cracks. Remove anything that might make you trip as you walk through a door, such as a raised step or threshold. Trim any bushes or trees on the path to your home. Use bright outdoor lighting. Clear any walking paths of anything that might make someone trip, such as rocks or tools. Regularly check to see if handrails are loose or broken. Make sure that both sides of any steps have handrails. Any raised decks and porches should have guardrails on the edges. Have any leaves, snow, or ice cleared regularly. Use sand or salt on walking paths during winter. Clean up any spills in your garage right away. This includes oil or grease spills. What can I do in the bathroom? Use night lights. Install grab bars by the toilet and in the tub and shower. Do not use towel bars as grab bars. Use non-skid mats or decals in the tub or shower. If you need to sit down in the shower, use a plastic, non-slip stool. Keep the floor dry. Clean up any water that spills on the floor as soon as it happens. Remove soap buildup in the tub or shower regularly. Attach bath mats securely with double-sided non-slip rug tape. Do not have throw rugs and other things on the floor that can make you trip. What can I do in the bedroom? Use night lights. Make sure that you have a light by your bed that is easy to reach. Do not use any sheets or blankets that are too big for your bed. They should not hang down  onto the floor. Have a firm chair that has side arms. You can use this for support while you get dressed. Do not have throw rugs and other things on the floor that can make you trip. What can I do in the kitchen? Clean up any spills right away. Avoid walking on wet floors. Keep items that you use a lot in easy-to-reach places. If you need to reach something above you, use a strong step stool that has a grab bar. Keep electrical cords out of the way. Do not use floor polish or wax that makes floors slippery. If you must use wax, use non-skid floor wax. Do not have throw rugs and other things on the floor that can make you trip. What can I do with my stairs? Do not leave any items on the stairs. Make sure that there are handrails on both sides of the stairs and use them. Fix handrails that are broken or loose. Make sure that handrails are as long as the stairways. Check any carpeting to make sure that it is firmly attached to the stairs. Fix any carpet that is loose or worn. Avoid having throw rugs at the top or bottom of the stairs. If you do have throw rugs, attach them to the floor with carpet tape. Make sure that you have a light switch at the top of  the stairs and the bottom of the stairs. If you do not have them, ask someone to add them for you. What else can I do to help prevent falls? Wear shoes that: Do not have high heels. Have rubber bottoms. Are comfortable and fit you well. Are closed at the toe. Do not wear sandals. If you use a stepladder: Make sure that it is fully opened. Do not climb a closed stepladder. Make sure that both sides of the stepladder are locked into place. Ask someone to hold it for you, if possible. Clearly mark and make sure that you can see: Any grab bars or handrails. First and last steps. Where the edge of each step is. Use tools that help you move around (mobility aids) if they are needed. These include: Canes. Walkers. Scooters. Crutches. Turn  on the lights when you go into a dark area. Replace any light bulbs as soon as they burn out. Set up your furniture so you have a clear path. Avoid moving your furniture around. If any of your floors are uneven, fix them. If there are any pets around you, be aware of where they are. Review your medicines with your doctor. Some medicines can make you feel dizzy. This can increase your chance of falling. Ask your doctor what other things that you can do to help prevent falls. This information is not intended to replace advice given to you by your health care provider. Make sure you discuss any questions you have with your health care provider. Document Released: 07/27/2009 Document Revised: 03/07/2016 Document Reviewed: 11/04/2014 Elsevier Interactive Patient Education  2017 [employer].

## 2023-07-05 ENCOUNTER — Other Ambulatory Visit: Payer: Self-pay

## 2023-07-08 ENCOUNTER — Telehealth: Payer: Self-pay | Admitting: Neurology

## 2023-07-08 NOTE — Telephone Encounter (Signed)
Patients sister called stating that she is needing a call back, Aaron Robertson is not well

## 2023-07-09 ENCOUNTER — Other Ambulatory Visit: Payer: Self-pay

## 2023-07-09 DIAGNOSIS — R4182 Altered mental status, unspecified: Secondary | ICD-10-CM

## 2023-07-09 NOTE — Telephone Encounter (Signed)
Talked with patients sister. Since backing off of Pramipexole patient has gotten worse. His hallucinations have become more real and he is seeing people all in his house and even thinks they are sitting around his den area. Patient is gradually getting worse as far as knowing what is real and not real. Patients sister wanting to know if this means patient is in the 5th stage of parkinson's ? Is patient getting parkinson's dementia? Patients sister wanting to bump his medication up  Patient has an appointment on October 9th at [facility]

## 2023-07-09 NOTE — Telephone Encounter (Signed)
Besides for the pramipexole d/c, order MRI brain without.  Dx:  MS change

## 2023-07-09 NOTE — Telephone Encounter (Signed)
Called patients sister and they are going to cancel the pramipexole and I am ordering the MRI

## 2023-07-10 ENCOUNTER — Ambulatory Visit (HOSPITAL_COMMUNITY): Payer: Medicare HMO | Attending: Neurology

## 2023-07-23 DIAGNOSIS — G20A1 Parkinson's disease without dyskinesia, without mention of fluctuations: Secondary | ICD-10-CM | POA: Diagnosis not present

## 2023-07-23 DIAGNOSIS — R1310 Dysphagia, unspecified: Secondary | ICD-10-CM | POA: Diagnosis not present

## 2023-07-23 DIAGNOSIS — R413 Other amnesia: Secondary | ICD-10-CM | POA: Diagnosis not present

## 2023-07-29 ENCOUNTER — Other Ambulatory Visit (HOSPITAL_COMMUNITY): Payer: Self-pay | Admitting: Occupational Therapy

## 2023-07-29 DIAGNOSIS — G20B1 Parkinson's disease with dyskinesia, without mention of fluctuations: Secondary | ICD-10-CM

## 2023-07-29 DIAGNOSIS — R1312 Dysphagia, oropharyngeal phase: Secondary | ICD-10-CM

## 2023-08-01 ENCOUNTER — Encounter (HOSPITAL_COMMUNITY): Payer: Self-pay | Admitting: Neurology

## 2023-08-04 ENCOUNTER — Ambulatory Visit
Admission: RE | Admit: 2023-08-04 | Discharge: 2023-08-04 | Disposition: A | Discharge status: Home / Self Care | Payer: Medicare HMO | Source: Ambulatory Visit | Attending: Neurology | Admitting: Neurology

## 2023-08-04 DIAGNOSIS — R4182 Altered mental status, unspecified: Secondary | ICD-10-CM | POA: Diagnosis not present

## 2023-08-11 ENCOUNTER — Telehealth: Payer: Self-pay | Admitting: Neurology

## 2023-08-11 NOTE — Telephone Encounter (Signed)
Called patient and gave results again. Patient claims he no longer is seeing hallucinations and he feels very good

## 2023-08-11 NOTE — Telephone Encounter (Signed)
Pt called in and left a message with the access nurse. He would like to get his MRI results

## 2023-08-11 NOTE — Telephone Encounter (Signed)
Called pateint gave results again

## 2023-08-11 NOTE — Telephone Encounter (Signed)
Pt called asking for MRI results 

## 2023-08-19 ENCOUNTER — Other Ambulatory Visit: Payer: Self-pay | Admitting: Neurology

## 2023-08-19 DIAGNOSIS — G20A1 Parkinson's disease without dyskinesia, without mention of fluctuations: Secondary | ICD-10-CM

## 2023-08-25 ENCOUNTER — Other Ambulatory Visit (HOSPITAL_COMMUNITY): Payer: Medicare HMO

## 2023-08-25 ENCOUNTER — Ambulatory Visit (HOSPITAL_COMMUNITY): Payer: Medicare HMO | Attending: Neurology | Admitting: Speech Pathology

## 2023-09-09 ENCOUNTER — Encounter: Payer: Self-pay | Admitting: Neurology

## 2023-09-15 ENCOUNTER — Ambulatory Visit: Payer: Medicare HMO | Admitting: Family Medicine

## 2023-09-16 ENCOUNTER — Encounter: Payer: Self-pay | Admitting: Family Medicine

## 2023-09-16 ENCOUNTER — Telehealth: Payer: Self-pay | Admitting: Emergency Medicine

## 2023-09-16 ENCOUNTER — Ambulatory Visit (INDEPENDENT_AMBULATORY_CARE_PROVIDER_SITE_OTHER): Payer: Medicare HMO | Admitting: Family Medicine

## 2023-09-16 VITALS — BP 118/80 | HR 87 | Temp 97.7°F | Ht 68.0 in | Wt 156.4 lb

## 2023-09-16 DIAGNOSIS — R443 Hallucinations, unspecified: Secondary | ICD-10-CM

## 2023-09-16 DIAGNOSIS — G20A1 Parkinson's disease without dyskinesia, without mention of fluctuations: Secondary | ICD-10-CM

## 2023-09-16 NOTE — Patient Instructions (Signed)
Please follow up with your Neurologist in [facility].

## 2023-09-16 NOTE — Telephone Encounter (Signed)
Patient has been dismissed from [facility] based on the patient's decision to transfer care to another neurology practice.
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
## 2023-09-19 ENCOUNTER — Ambulatory Visit: Payer: Medicare HMO | Admitting: Neurology

## 2023-10-23 DIAGNOSIS — G20A1 Parkinson's disease without dyskinesia, without mention of fluctuations: Secondary | ICD-10-CM | POA: Diagnosis not present

## 2023-10-28 ENCOUNTER — Encounter: Payer: Self-pay | Admitting: Family Medicine

## 2023-10-28 ENCOUNTER — Ambulatory Visit: Payer: Medicare Other | Admitting: Family Medicine

## 2023-10-28 VITALS — BP 95/61 | HR 80 | Temp 98.2°F | Ht 68.0 in | Wt 156.0 lb

## 2023-10-28 DIAGNOSIS — R269 Unspecified abnormalities of gait and mobility: Secondary | ICD-10-CM | POA: Diagnosis not present

## 2023-10-28 DIAGNOSIS — E559 Vitamin D deficiency, unspecified: Secondary | ICD-10-CM | POA: Diagnosis not present

## 2023-10-28 DIAGNOSIS — R251 Tremor, unspecified: Secondary | ICD-10-CM

## 2023-10-28 DIAGNOSIS — N401 Enlarged prostate with lower urinary tract symptoms: Secondary | ICD-10-CM | POA: Diagnosis not present

## 2023-10-28 DIAGNOSIS — G20A1 Parkinson's disease without dyskinesia, without mention of fluctuations: Secondary | ICD-10-CM | POA: Diagnosis not present

## 2023-10-28 DIAGNOSIS — E7800 Pure hypercholesterolemia, unspecified: Secondary | ICD-10-CM

## 2023-10-28 DIAGNOSIS — Z1211 Encounter for screening for malignant neoplasm of colon: Secondary | ICD-10-CM

## 2023-10-28 DIAGNOSIS — R351 Nocturia: Secondary | ICD-10-CM

## 2023-10-28 DIAGNOSIS — Z0000 Encounter for general adult medical examination without abnormal findings: Secondary | ICD-10-CM
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
## 2023-10-28 NOTE — Patient Instructions (Addendum)
 Please make lab appt    Thank you for coming in to clinic today.  1. Your symptoms are consistent with Constipation, likely cause of your General Abdominal Pain / Cramping. 2. Start with Miralax.. First dose 68g (4 capfuls) in 32oz water over 1 to 2 hours for clean out. Next day start 17g or 1 capful daily, may adjust dose up or down by half a capful every few days. Recommend to take this medicine daily for next 1-2 weeks, you may need to use it longer if needed. - Goal is to have soft regular bowel movement 1-3x daily, if too runny or diarrhea, then reduce dose of the medicine to every other day.  Improve water intake, hydration will help Also recommend increased vegetables, fruits, fiber intake Can try daily Metamucil or Fiber supplement at pharmacy over the counter  Follow-up if symptoms are not improving with bowel movements, or if pain worsens, develop fevers, nausea, vomiting.  Please schedule a follow-up appointment in 1 month to follow-up Constipation  If you have any other questions or concerns, please feel free to call the clinic to contact me. You may also schedule an earlier appointment if necessary.  However, if your symptoms get significantly worse, please go to the Emergency Department to seek immediate medical attention.

## 2023-11-04 ENCOUNTER — Other Ambulatory Visit: Payer: Medicare Other

## 2023-11-04 DIAGNOSIS — N401 Enlarged prostate with lower urinary tract symptoms: Secondary | ICD-10-CM

## 2023-11-04 DIAGNOSIS — G20A1 Parkinson's disease without dyskinesia, without mention of fluctuations: Secondary | ICD-10-CM | POA: Diagnosis not present

## 2023-11-04 DIAGNOSIS — E7800 Pure hypercholesterolemia, unspecified: Secondary | ICD-10-CM

## 2023-11-04 LAB — LIPID PANEL

## 2023-11-05 ENCOUNTER — Telehealth: Payer: Self-pay | Admitting: *Deleted

## 2023-11-05 ENCOUNTER — Encounter: Payer: Self-pay | Admitting: Family Medicine

## 2023-11-05 LAB — CBC WITH DIFFERENTIAL/PLATELET
Basophils Absolute: 0 10*3/uL (ref 0.0–0.2)
Basos: 1 %
EOS (ABSOLUTE): 0.1 10*3/uL (ref 0.0–0.4)
Eos: 2 %
Hematocrit: 44.4 % (ref 37.5–51.0)
Hemoglobin: 14.9 g/dL (ref 13.0–17.7)
Immature Grans (Abs): 0 10*3/uL (ref 0.0–0.1)
Immature Granulocytes: 0 %
Lymphocytes Absolute: 1.3 10*3/uL (ref 0.7–3.1)
Lymphs: 33 %
MCH: 31.2 pg (ref 26.6–33.0)
MCHC: 33.6 g/dL (ref 31.5–35.7)
MCV: 93 fL (ref 79–97)
Monocytes Absolute: 0.3 10*3/uL (ref 0.1–0.9)
Monocytes: 8 %
Neutrophils Absolute: 2.1 10*3/uL (ref 1.4–7.0)
Neutrophils: 56 %
Platelets: 176 10*3/uL (ref 150–450)
RBC: 4.77 x10E6/uL (ref 4.14–5.80)
RDW: 12.4 % (ref 11.6–15.4)
WBC: 3.8 10*3/uL (ref 3.4–10.8)

## 2023-11-05 LAB — CMP14+EGFR
ALT: 10 IU/L (ref 0–44)
AST: 20 IU/L (ref 0–40)
Albumin: 4.1 g/dL (ref 3.9–4.9)
Alkaline Phosphatase: 111 IU/L (ref 44–121)
BUN/Creatinine Ratio: 22 (ref 10–24)
BUN: 17 mg/dL (ref 8–27)
Bilirubin Total: 0.5 mg/dL (ref 0.0–1.2)
CO2: 25 mmol/L (ref 20–29)
Calcium: 9.5 mg/dL (ref 8.6–10.2)
Chloride: 99 mmol/L (ref 96–106)
Creatinine, Ser: 0.79 mg/dL (ref 0.76–1.27)
Globulin, Total: 2.7 g/dL (ref 1.5–4.5)
Glucose: 94 mg/dL (ref 70–99)
Potassium: 4.4 mmol/L (ref 3.5–5.2)
Sodium: 138 mmol/L (ref 134–144)
Total Protein: 6.8 g/dL (ref 6.0–8.5)
eGFR: 97 mL/min/{1.73_m2} (ref >59)

## 2023-11-05 LAB — LIPID PANEL
Cholesterol, Total: 177 mg/dL (ref 100–199)
HDL: 58 mg/dL (ref >39)
LDL CALC COMMENT:: 3.1 ratio (ref 0.0–5.0)
LDL Chol Calc (NIH): 99 mg/dL (ref 0–99)
Triglycerides: 113 mg/dL (ref 0–149)
VLDL Cholesterol Cal: 20 mg/dL (ref 5–40)

## 2023-11-05 LAB — PSA, TOTAL AND FREE
PSA, Free Pct: 72 %
PSA, Free: 0.36 ng/mL
Prostate Specific Ag, Serum: 0.5 ng/mL (ref 0.0–4.0)

## 2023-11-05 NOTE — Telephone Encounter (Signed)
Patient states that he started having erectile dysfunction about a week ago and is wanting to know if Aaron Robertson can treat for that?considering his condition with Parkinson's

## 2023-11-05 NOTE — Progress Notes (Signed)
All labs normal. However, based on ASCVD risk score, it is recommended that patient start a statin. If agreeable can send in crestor.   The 10-year ASCVD risk score (Arnett DK, et al., 2019) is: 8.3%   Values used to calculate the score:     Age: 68 years     Sex: Male     Is Non-Hispanic African American: No     Diabetic: No     Tobacco smoker: No     Systolic Blood Pressure: 95 mmHg     Is BP treated: No     HDL Cholesterol: 58 mg/dL     Total Cholesterol: 177 mg/dL

## 2023-11-06 NOTE — Telephone Encounter (Signed)
Patient informed. LS

## 2023-11-10 ENCOUNTER — Ambulatory Visit: Payer: Medicare HMO | Admitting: Neurology

## 2023-11-15 ENCOUNTER — Other Ambulatory Visit: Payer: Self-pay | Admitting: Neurology

## 2023-11-15 DIAGNOSIS — G20A1 Parkinson's disease without dyskinesia, without mention of fluctuations: Secondary | ICD-10-CM

## 2024-01-27 ENCOUNTER — Ambulatory Visit: Payer: Medicare Other | Admitting: Family Medicine

## 2024-01-27 ENCOUNTER — Encounter: Payer: Self-pay | Admitting: Family Medicine

## 2024-02-23 DIAGNOSIS — G20A1 Parkinson's disease without dyskinesia, without mention of fluctuations: Secondary | ICD-10-CM | POA: Diagnosis not present

## 2024-04-21 DIAGNOSIS — G20A1 Parkinson's disease without dyskinesia, without mention of fluctuations: Secondary | ICD-10-CM | POA: Diagnosis not present

## 2024-04-29 ENCOUNTER — Ambulatory Visit: Payer: Self-pay

## 2024-04-29 NOTE — Telephone Encounter (Signed)
 FYI Only or Action Required?: FYI only for provider. Sister states neuro recommended he be prescribed a sedative by PCP - appt scheduled with provider recommended on CAL line. Sister does not wish for him to go to the ED.  Patient was last seen in primary care on 10/28/2023 by Cathlene Marry Lenis, FNP.  Called Nurse Triage reporting Tremors and Parkinsons symptoms.  Symptoms began ongoing.  Interventions attempted: Prescription medications: per list.  Symptoms are: gradually worsening.  Triage Disposition: No disposition on file.  Patient/caregiver understands and will follow disposition?:   Copied from CRM [id number]. Topic: Clinical - Red Word Triage >> Apr 29, 2024  4:37 PM Selinda RAMAN wrote: Red Word that prompted transfer to Nurse Triage: Donny the patients sister and POA called in stating the patient has Parkinson's and he is on many medications including carbidopa -levodopa  (SINEMET  IR) 25-100 MG tablet, nortriptyline (PAMELOR) 10 MG capsule for drooling and pramipexole  (MIRAPEX ) 1 MG tablet for his shakes. He was recently put on Seroquel by his Neurologist Dr Charlyne with [facility] and she said it makes him hallucinate really bad. I will transfer her to E2C2 NT Reason for Disposition  [1] Hallucinations AND [2] unable to do any of normal activities (e.g., self-care, school, work; in comparison to baseline)  Answer Assessment - Initial Assessment Questions 1. MAIN CONCERN: What happened that made you call today?     Patient's sister reports that they reached out to his neurologist regarding increase in his hallucinations and parkinsons symptoms and that they were told to request a sedative from the PCP.  2. DESCRIPTION: What are the hallucinations like? Describe them for me. (e.g., auditory, visual, tactile; worsening; threatening) If auditory, ask Are they telling you to hurt yourself or someone else?     Seeing people. Sister denies him having or expressing suicidal  ideations or feeling as if he wants to or may hurt someone. He had guns in the home, but those have been removed.  7. MENTAL HEALTH MEDICINES: Are you taking any medicines for depression or other mental health problems? (e.g., psychiatric medicines; sleeping pills)     Parkinsons - see med list and neuro OV notes  9. MEDICINE CHANGES: Did you recently stop taking a medicine? (e.g., antidepressant, barbiturates, benzodiazepine, gabapentin)  Did you recently start a new medicine or increase the dose? (e.g., antidepressant, digoxin, sleep medicine, steroid, Parkinson's medicine)     Sister states seroquel was recently added.  Protocols used: Hallucinations-A-AH

## 2024-04-30 ENCOUNTER — Ambulatory Visit: Payer: Self-pay | Admitting: Family Medicine

## 2024-04-30 ENCOUNTER — Ambulatory Visit (INDEPENDENT_AMBULATORY_CARE_PROVIDER_SITE_OTHER): Admitting: Family Medicine

## 2024-04-30 ENCOUNTER — Encounter: Payer: Self-pay | Admitting: Family Medicine

## 2024-04-30 VITALS — BP 111/71 | HR 87 | Temp 97.7°F | Ht 68.0 in | Wt 152.6 lb

## 2024-04-30 DIAGNOSIS — R410 Disorientation, unspecified: Secondary | ICD-10-CM | POA: Diagnosis not present

## 2024-04-30 DIAGNOSIS — F068 Other specified mental disorders due to known physiological condition: Secondary | ICD-10-CM

## 2024-04-30 DIAGNOSIS — G20A1 Parkinson's disease without dyskinesia, without mention of fluctuations: Secondary | ICD-10-CM | POA: Diagnosis not present

## 2024-04-30 DIAGNOSIS — R443 Hallucinations, unspecified: Secondary | ICD-10-CM

## 2024-04-30 LAB — MICROSCOPIC EXAMINATION
Bacteria, UA: NONE SEEN
RBC, Urine: NONE SEEN /HPF (ref 0–2)
Renal Epithel, UA: NONE SEEN /HPF
Yeast, UA: NONE SEEN

## 2024-04-30 LAB — URINALYSIS, ROUTINE W REFLEX MICROSCOPIC
Bilirubin, UA: NEGATIVE
Glucose, UA: NEGATIVE
Ketones, UA: NEGATIVE
Leukocytes,UA: NEGATIVE
Nitrite, UA: NEGATIVE
RBC, UA: NEGATIVE
Specific Gravity, UA: >1.03 — ABNORMAL HIGH (ref 1.005–1.030)
Urobilinogen, Ur: 1 mg/dL (ref 0.2–1.0)
pH, UA: 5.5 (ref 5.0–7.5)
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
## 2024-05-01 LAB — ANEMIA PROFILE B
Basophils Absolute: 0 10*3/uL (ref 0.0–0.2)
Basos: 0 %
EOS (ABSOLUTE): 0.1 10*3/uL (ref 0.0–0.4)
Eos: 2 %
Ferritin: 176 ng/mL (ref 30–400)
Folate: 15.2 ng/mL
Hematocrit: 41.6 % (ref 37.5–51.0)
Hemoglobin: 13.7 g/dL (ref 13.0–17.7)
Immature Grans (Abs): 0 10*3/uL (ref 0.0–0.1)
Immature Granulocytes: 0 %
Iron Saturation: 28 % (ref 15–55)
Iron: 75 ug/dL (ref 38–169)
Lymphocytes Absolute: 1.6 10*3/uL (ref 0.7–3.1)
Lymphs: 33 %
MCH: 30.9 pg (ref 26.6–33.0)
MCHC: 32.9 g/dL (ref 31.5–35.7)
MCV: 94 fL (ref 79–97)
Monocytes Absolute: 0.4 10*3/uL (ref 0.1–0.9)
Monocytes: 8 %
Neutrophils Absolute: 2.7 10*3/uL (ref 1.4–7.0)
Neutrophils: 57 %
Platelets: 162 10*3/uL (ref 150–450)
RBC: 4.43 x10E6/uL (ref 4.14–5.80)
RDW: 13.2 % (ref 11.6–15.4)
Retic Ct Pct: 1.2 % (ref 0.6–2.6)
Total Iron Binding Capacity: 271 ug/dL (ref 250–450)
UIBC: 196 ug/dL (ref 111–343)
Vitamin B-12: 426 pg/mL (ref 232–1245)
WBC: 4.9 10*3/uL (ref 3.4–10.8)

## 2024-05-01 LAB — CMP14+EGFR
ALT: 5 [IU]/L (ref 0–44)
AST: 24 [IU]/L (ref 0–40)
Albumin: 4.2 g/dL (ref 3.9–4.9)
Alkaline Phosphatase: 119 [IU]/L (ref 44–121)
BUN/Creatinine Ratio: 21 (ref 10–24)
BUN: 19 mg/dL (ref 8–27)
Bilirubin Total: 0.3 mg/dL (ref 0.0–1.2)
CO2: 27 mmol/L (ref 20–29)
Calcium: 9.2 mg/dL (ref 8.6–10.2)
Chloride: 102 mmol/L (ref 96–106)
Creatinine, Ser: 0.91 mg/dL (ref 0.76–1.27)
Globulin, Total: 2.6 g/dL (ref 1.5–4.5)
Glucose: 88 mg/dL (ref 70–99)
Potassium: 4.5 mmol/L (ref 3.5–5.2)
Sodium: 141 mmol/L (ref 134–144)
Total Protein: 6.8 g/dL (ref 6.0–8.5)
eGFR: 92 mL/min/{1.73_m2}

## 2024-05-01 LAB — SYPHILIS: RPR W/REFLEX TO RPR TITER AND TREPONEMAL ANTIBODIES, TRADITIONAL SCREENING AND DIAGNOSIS ALGORITHM: RPR Ser Ql: NONREACTIVE

## 2024-05-20 DIAGNOSIS — H2513 Age-related nuclear cataract, bilateral: Secondary | ICD-10-CM | POA: Diagnosis not present

## 2024-05-20 DIAGNOSIS — H40033 Anatomical narrow angle, bilateral: Secondary | ICD-10-CM | POA: Diagnosis not present

## 2024-05-24 DIAGNOSIS — L570 Actinic keratosis: Secondary | ICD-10-CM | POA: Diagnosis not present

## 2024-06-08 ENCOUNTER — Ambulatory Visit (INDEPENDENT_AMBULATORY_CARE_PROVIDER_SITE_OTHER): Admitting: Family Medicine

## 2024-06-08 ENCOUNTER — Encounter: Payer: Self-pay | Admitting: Family Medicine

## 2024-06-08 ENCOUNTER — Telehealth: Payer: Self-pay

## 2024-06-08 VITALS — BP 103/67 | HR 94 | Temp 97.6°F | Ht 68.0 in | Wt 154.4 lb

## 2024-06-08 DIAGNOSIS — R443 Hallucinations, unspecified: Secondary | ICD-10-CM | POA: Diagnosis not present

## 2024-06-08 DIAGNOSIS — G20B2 Parkinson's disease with dyskinesia, with fluctuations: Secondary | ICD-10-CM

## 2024-06-08 DIAGNOSIS — G20A1 Parkinson's disease without dyskinesia, without mention of fluctuations: Secondary | ICD-10-CM | POA: Diagnosis not present

## 2024-06-08 DIAGNOSIS — F068 Other specified mental disorders due to known physiological condition: Secondary | ICD-10-CM | POA: Diagnosis not present

## 2024-06-08 DIAGNOSIS — J61 Pneumoconiosis due to asbestos and other mineral fibers: Secondary | ICD-10-CM | POA: Insufficient documentation
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
## 2024-06-08 NOTE — Telephone Encounter (Signed)
 Copied from CRM #[id number]. Topic: General - Call Back - No Documentation >> Jun 08, 2024  1:59 PM Ivette P wrote: Reason for CRM: Leita called in to return call from [place], called [facility] and spoke to [place]. And michelle was not available.   Pls call Leita back  [phone]

## 2024-06-15 NOTE — Telephone Encounter (Signed)
 PCP has spoken to neurologist

## 2024-07-14 ENCOUNTER — Ambulatory Visit: Payer: Medicare HMO

## 2024-07-14 VITALS — Ht 68.0 in | Wt 154.0 lb

## 2024-07-14 DIAGNOSIS — Z0000 Encounter for general adult medical examination without abnormal findings: Secondary | ICD-10-CM

## 2024-07-14 NOTE — Patient Instructions (Signed)
 [name],  Thank you for taking the time for your Medicare Wellness Visit. I appreciate your continued commitment to your health goals. Please review the care plan we discussed, and feel free to reach out if I can assist you further.  Medicare recommends these wellness visits once per year to help you and your care team stay ahead of potential health issues. These visits are designed to focus on prevention, allowing your provider to concentrate on managing your acute and chronic conditions during your regular appointments.  Please note that Annual Wellness Visits do not include a physical exam. Some assessments may be limited, especially if the visit was conducted virtually. If needed, we may recommend a separate in-person follow-up with your provider.  Ongoing Care Seeing your primary care provider every 3 to 6 months helps us  monitor your health and provide consistent, personalized care. Last office visit on 06/08/2024.    Referrals If a referral was made during today's visit and you haven't received any updates within two weeks, please contact the referred provider directly to check on the status.  Recommended Screenings:  Health Maintenance  Topic Date Due   Pneumococcal Vaccine for age over 18 (1 of 1 - PCV) Never done   Colon Cancer Screening  08/19/2016   COVID-19 Vaccine (1 - 2024-25 season) Never done   Medicare Annual Wellness Visit  07/01/2024   Zoster (Shingles) Vaccine (1 of 2) 09/08/2024*   Flu Shot  01/11/2025*   DTaP/Tdap/Td vaccine (2 - Td or Tdap) 07/22/2027   Hepatitis C Screening  Completed   HPV Vaccine  Aged Out   Meningitis B Vaccine  Aged Out  *Topic was postponed. The date shown is not the original due date.       07/14/2024    8:28 AM  Advanced Directives  Does Patient Have a Medical Advance Directive? Yes  Type of Estate agent of [place];Living will  Copy of Healthcare Power of Attorney in Chart? No - copy requested   Advance  Care Planning is important because it: Ensures you receive medical care that aligns with your values, goals, and preferences. Provides guidance to your family and loved ones, reducing the emotional burden of decision-making during critical moments.  Vision: Annual vision screenings are recommended for early detection of glaucoma, cataracts, and diabetic retinopathy. These exams can also reveal signs of chronic conditions such as diabetes and high blood pressure.  Dental: Annual dental screenings help detect early signs of oral cancer, gum disease, and other conditions linked to overall health, including heart disease and diabetes.  Please see the attached documents for additional preventive care recommendations.
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
## 2024-07-15 DIAGNOSIS — H10013 Acute follicular conjunctivitis, bilateral: Secondary | ICD-10-CM | POA: Diagnosis not present

## 2024-07-16 ENCOUNTER — Telehealth: Payer: Self-pay | Admitting: Family Medicine

## 2024-07-16 DIAGNOSIS — G20B2 Parkinson's disease with dyskinesia, with fluctuations: Secondary | ICD-10-CM

## 2024-07-16 DIAGNOSIS — R443 Hallucinations, unspecified: Secondary | ICD-10-CM

## 2024-07-16 DIAGNOSIS — F068 Other specified mental disorders due to known physiological condition: Secondary | ICD-10-CM

## 2024-07-16 NOTE — Telephone Encounter (Signed)
 Copied from CRM [id number]. Topic: Clinical - Medication Question >> Jul 16, 2024  2:21 PM Teressa P wrote: Reason for CRM: Patient's sister Donny is calling about a medication that Rock Bruns had told her at his last appt that they could give the patient for mood but it had to be okayed by his neurologist.  She said they have not heard anything back and they his moods have been getting worse.  Please advise [phone]

## 2024-07-16 NOTE — Telephone Encounter (Signed)
 Per pt's sister neurology ok'd abilify to be sent in. Please let her know once it is sent in. See your last OV notes.

## 2024-07-18 MED ORDER — ARIPIPRAZOLE 2 MG PO TABS: 2.0000 mg | ORAL_TABLET | Freq: Every day | ORAL | 3 refills | Status: DC

## 2024-07-19 NOTE — Telephone Encounter (Signed)
 Patient notified and verbalized understanding. Appt made for follow up in a couple weeks.

## 2024-07-22 DIAGNOSIS — G20A1 Parkinson's disease without dyskinesia, without mention of fluctuations: Secondary | ICD-10-CM | POA: Diagnosis not present
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
## 2024-07-30 DIAGNOSIS — H04123 Dry eye syndrome of bilateral lacrimal glands: Secondary | ICD-10-CM | POA: Diagnosis not present

## 2024-08-03 ENCOUNTER — Telehealth: Payer: Self-pay

## 2024-08-03 NOTE — Telephone Encounter (Signed)
 Copied from CRM #[id number]. Topic: Clinical - Prescription Issue >> Aug 02, 2024  4:37 PM Graeme ORN wrote: Reason for CRM: Patient sister called. States provider prescribed medication for mood/anxiety that they did not go pick up due to side effects - specifically increases sex drive, constipation, diarrhea, insomnia. States she would like to see what else could be suggested. States please do not contact patient she is healthcare POA and calls just confuse patient even more. Please contact Aaron Robertson. Thank You

## 2024-08-03 NOTE — Telephone Encounter (Signed)
Sister aware and verbalizes understanding.

## 2024-08-10 ENCOUNTER — Encounter (INDEPENDENT_AMBULATORY_CARE_PROVIDER_SITE_OTHER): Admitting: Family Medicine

## 2024-08-10 ENCOUNTER — Encounter: Payer: Self-pay | Admitting: Family Medicine

## 2024-08-11 NOTE — Progress Notes (Signed)
 Not seen

## 2024-08-30 ENCOUNTER — Telehealth: Payer: Self-pay

## 2024-08-30 NOTE — Telephone Encounter (Signed)
 Copied from CRM #[id number]. Topic: Clinical - Medication Question >> Aug 30, 2024  3:38 PM Everette C wrote: Reason for CRM: The patient's sister would like to be contacted by a member of clinical staff to discuss the patients medication continuity and concerns regarding the patient's recent cessation of their medications.

## 2024-08-31 ENCOUNTER — Ambulatory Visit: Payer: Self-pay | Admitting: Family Medicine

## 2024-08-31 NOTE — Telephone Encounter (Signed)
 Spoke with sister who is on dpr and answered her questions

## 2024-09-02 ENCOUNTER — Other Ambulatory Visit: Payer: Self-pay | Admitting: Family Medicine

## 2024-09-02 DIAGNOSIS — F068 Other specified mental disorders due to known physiological condition: Secondary | ICD-10-CM

## 2024-09-02 DIAGNOSIS — G20B2 Parkinson's disease with dyskinesia, with fluctuations: Secondary | ICD-10-CM

## 2024-09-02 DIAGNOSIS — R443 Hallucinations, unspecified: Secondary | ICD-10-CM
